# Patient Record
Sex: Female | Born: 1985 | Race: White | Hispanic: No | Smoking: Current every day smoker
Health system: Southern US, Community
[De-identification: ages and names within clinical notes are randomized; demographics above are authoritative.]

## PROBLEM LIST (undated history)

## (undated) HISTORY — PX: TONSILLECTOMY AND ADENOIDECTOMY: SHX28

---

## 2005-07-19 ENCOUNTER — Inpatient Hospital Stay (HOSPITAL_COMMUNITY): Admission: EM | Admit: 2005-07-19 | Discharge: 2005-07-24 | Payer: Self-pay | Admitting: Emergency Medicine

## 2007-06-04 ENCOUNTER — Emergency Department (HOSPITAL_COMMUNITY): Admission: EM | Admit: 2007-06-04 | Discharge: 2007-06-04 | Payer: Self-pay | Admitting: Emergency Medicine

## 2007-09-15 ENCOUNTER — Ambulatory Visit: Payer: Self-pay | Admitting: *Deleted

## 2007-09-15 ENCOUNTER — Other Ambulatory Visit: Payer: Self-pay | Admitting: Emergency Medicine

## 2007-09-15 ENCOUNTER — Inpatient Hospital Stay (HOSPITAL_COMMUNITY): Admission: AD | Admit: 2007-09-15 | Discharge: 2007-09-15 | Payer: Self-pay | Admitting: Obstetrics and Gynecology

## 2007-09-15 ENCOUNTER — Encounter: Payer: Self-pay | Admitting: Obstetrics and Gynecology

## 2007-09-16 ENCOUNTER — Inpatient Hospital Stay (HOSPITAL_COMMUNITY): Admission: AD | Admit: 2007-09-16 | Discharge: 2007-09-16 | Payer: Self-pay | Admitting: Family Medicine

## 2008-07-20 ENCOUNTER — Emergency Department (HOSPITAL_COMMUNITY): Admission: EM | Admit: 2008-07-20 | Discharge: 2008-07-20 | Payer: Self-pay | Admitting: Emergency Medicine

## 2011-05-01 NOTE — Discharge Summary (Signed)
[name], [name]              ACCOUNT NO.:  [id number]   MEDICAL RECORD NO.:  [id number]          PATIENT TYPE:  INP   LOCATION:  9307                          FACILITY:  [facility]   PHYSICIAN:  Tanya S. Shawnie Pons, M.D.   DATE OF BIRTH:  1986/08/15   DATE OF ADMISSION:  09/15/2007  DATE OF DISCHARGE:  09/15/2007                               DISCHARGE SUMMARY   ADMISSION DIAGNOSES:  1. Intrauterine pregnancy at unknown gestational age.  2. Spontaneous onset of labor.  3. No prenatal  care.  4. Spontaneous rupture of membranes.   DISCHARGE DIAGNOSES:  1. Status post spontaneous vaginal delivery postpartum day #0, female      intrauterine fetal demise appearing to be about [redacted] weeks gestation.  2. Elevated blood pressure.  3. Mildly elevated liver function tests.   PROCEDURES:  None.   COMPLICATIONS:  None.   CONSULTATIONS:  None.   LABORATORY VALUES:  [name] had an admission CBC showing white blood  cell count of 17.3, hemoglobin 9.0,  hematocrit 25.8, platelets 315.  HIV screen was nonreactive.  Blood type was O positive. CMP showed  normal electrolytes and kidney function.  AST 63, ALT 35, total protein  5.6, albumin 2.2, calcium 8.3.  LDH 187.  Uric acid 4.5.  RPR  nonreactive.   BRIEF ADMISSION HISTORY:  [name] is a 25 year old, gravida 2, para 1-  1-0-1, at unknown gestational age presenting as a transfer from [facility] in [place], [place], with an intrauterine fetal  demise.  The patient presented to [facility] after spontaneous rupture  of membranes earlier in the morning on September 15, 2007.  The patient  was evaluated there with ultrasound and fetal Doppler showing no fetal  heart sounds.  The patient progressed to completly dilated in transit  and was pushing at time of admission to the MAU.   HOSPITAL COURSE:  The patient was admitted to the MAU for delivery of a  nonviable female intrauterine fetal demise. Spontaneous vaginal delivery  occurred on September 15, 2007, at approximately 7:15 a.m.  The infant  was not moving and did nt spontaneously cry at birth.  Assessment showed  no fetal activity, and the infant was declared deceased after delivery.  The patient was noted to have elevated blood pressure to 160/90 in the  maternity admission unit.  Pregnancy-induced hypertension labs were  drawn showing mild elevation of her AST to 63, but the other labs were  within normal limits.  Her blood pressures were followed and were  150s/70s to 90s.  The patient was evaluated after delivery and found to  have a first-degree laceration that was hemostatic and not repaired.   The patient was doing  well during postpartum day #0, tolerating p.o.,  ambulating with decreasing lochia.  She stated that she did not want to  stay longer and desired discharge.  Due to her elevated blood pressure,  she was counseled to stay but stated she could not stay.  She was  counseled on the risks of possible seizure, continuing liver  dysfunction, bleeding dysfunction, and other  risks associated with  preeclampsia and HELLP syndrome.  The patient agreed to begin taking  hydrochlorothiazide 25 mg and follow up in the maternity admissions unit  tomorrow for blood pressure check.  The patient was to be discharged in  stable condition after funeral arrangements had been made for the  infant.   DISCHARGE STATUS:  Stable.   DISCHARGE MEDICATIONS:  1. Percocet 5/325 one to two tablets p.o. every 6 hours as needed for      pain, dispense #10.  2. Prenatal vitamins 1 p.o. daily.  3. Motrin 600 mg 1 p.o. every 6 hours as needed for pain.  4. Ferrous sulfate 325 mg 1 p.o. b.i.d.  5. Colace 100 mg 1 p.o. b.i.d.  6. Hydrochlorothiazide 25 mg 1 p.o. daily.   DISCHARGE INSTRUCTIONS:  1. Discharge to home.  2. Activity as tolerated.  3. Regular diet.  4. Follow up in the maternity admissions unit on September 16, 2007,      at approximately 9 a.m.  5.  The patient is to follow up for a 6-week postpartum check at the      [facility] Department. The patient is to call and      schedule that appointment.      Karlton Lemon, MD  Electronically Signed     ______________________________  Shelbie Proctor. Shawnie Pons, M.D.    NS/MEDQ  D:  09/15/2007  T:  09/15/2007  Job:  [id number]

## 2011-05-04 NOTE — Discharge Summary (Signed)
NAMEREHMAT, [name]              ACCOUNT NO.:  [id number]   MEDICAL RECORD NO.:  [id number]          PATIENT TYPE:  INP   LOCATION:  A222                          FACILITY:  [facility]   PHYSICIAN:  Calvert Cantor, M.D.     DATE OF BIRTH:  03-13-1986   DATE OF ADMISSION:  07/19/2005  DATE OF DISCHARGE:  LH                                 DISCHARGE SUMMARY   The patient does not have a primary care physician.  She last saw Dr. Gerda Diss  about five years ago.  She states that she wants to return to Dr. Gerda Diss.  However, if he is not taking any new patients, I have assigned her to Dr.  Nobie Putnam according to our unassigned chart.   DISCHARGE DIAGNOSES:  1.  Drug overdose with Flexeril and possibly other medications.  Her urine      was positive for TCA, opiates, and marijuana on admission.  2.  MRSA bronchitis.  3.  Anemia of iron deficiency.  4.  Ileus while in the ICU, now improved.  5.  Tachycardia, possibly secondary to Flexeril overdose.   DISCHARGE MEDICATIONS:  1.  Bactrim-DS 1 tablet q.12h.  2. Iron sulfate 325 mg b.i.d.   HOSPITAL COURSE:  This is a 25 year old white female who was arguing with  her husband and in the process she took his Flexeril which he was prescribed  for his back.  According to her husband, she took about 40-60 tablets of 10  mg of Flexeril.  The patient states that she only took 10 tablets.  The  patient was admitted with severe agitation.  She was intubated and sedated.  The patient remained intubated overnight.  On the following day, a waking  trial was performed, but she was still extremely agitated and, therefore,  had to be sedated again with Diprivan.  On the following day, the waking  trial was performed, and the patient was calm.  She was extubated, and she  did well status post extubation.  The only problem is that she is still  tachycardic.  She has been monitored for another 24 hours out of the ICU.  She is not having any arrythmias and has no  complaints other than a small  amount of yellowish sputum which she has been coughing up.   The patient has been seen by the ACT team and has an appointment for mental  health on 8/10 with Electa Sniff to follow up for her drug overdose and her  other drug abuse issues.   Blood work on discharge:  WBC count is 9.7, hemoglobin 11.5, hematocrit  33.5, MCV 88.  Platelets 396.  Sodium 136, potassium 3.7, chloride 105,  bicarbonate 22, glucose 83.  BUN 3, creatinine 0.5.  Calcium 8.9,  triglycerides 117, free T4 1.02, TSH 2.015, iron level was low at 53.  Iron  binding was 277.  Percent saturation was also low at 19.  Ferritin was 58.  Urine hCG was negative.  Acetaminophen and salicylate levels were less than  10 and less than 4 respectively.  Urine drug screen was positive for  opiates, cannabis, and TCA.  Alcohol level was less than 5. UA was normal.   PHYSICAL EXAMINATION:  Vitals on discharge:  Temperature is 97.9, pulse is  106, respirations are 18, blood pressure is 124/75, pulse ox has been 97% on  room air.   RADIOLOGY:  Chest x-ray on admission showed clear lungs, normal heart.  An x-ray of the abdomen done two days after admission showed a bowel gas  pattern consistent with ileus.    A repeat chest x-ray done on 8/7 to rule out pneumonia was also clear.   FOLLOW UP:  1.  The patient is to have a followup sputum culture in about two weeks'      time.  2. She is to have a followup CBC in about three months to check      her hemoglobin.  3. She is to follow up with mental health for drug      abuse issues.  4. She is to follow up with either Dr. Gerda Diss or Dr.      Nobie Putnam for her anemia and improvement in her tachycardia.      Calvert Cantor, M.D.  Electronically Signed     SR/MEDQ  D:  07/24/2005  T:  07/24/2005  Job:  [id number]   cc:   Lorin Picket A. Gerda Diss, MD  [address]  [place]  [place] [address]  Fax: [phone]   Patrica Duel, M.D.  [address], [address]  [place]  [place] [address]  Fax: [phone]

## 2011-05-04 NOTE — H&P (Signed)
[name], [name]              ACCOUNT NO.:  [id number]   MEDICAL RECORD NO.:  [id number]          PATIENT TYPE:  INP   LOCATION:  IC09                          FACILITY:  [facility]   PHYSICIAN:  [name], M.D.DATE OF BIRTH:  [birth year]   DATE OF ADMISSION:  07/19/2005  DATE OF DISCHARGE:  LH                                HISTORY & PHYSICAL   ADMISSION DIAGNOSES:  1.  Drug overdose with Flexeril.  2.  Acute confusional state with severe agitation.  3.  Possibly polysubstance abuse involved, not fully identified.   CHIEF COMPLAINT:  Drug overdose.   HISTORY OF PRESENT ILLNESS:  Most of the data was obtained from the medical  records and also from the mother of the patient. The patient is extremely  agitated and confused and is unable to give any information. It does appear  that the patient had some misunderstanding with a family member and  subsequently took about 60 mg of Flexeril. The patient has had more than a  five-year history of polysubstance abuse including use of Tylox and other  narcotic analgesia. She also smokes marijuana. The patient had an argument  with her husband and took her husband's pills he is taking for back pain. It  appears that they are fairly sure that all she took was Flexeril.   On arrival in the emergency room, she was extremely agitated and tried to  get out of bed. When I saw her in the intensive care unit, she was very  confused, agitated, and hyperadrenergic. Subsequently, despite giving her  Ativan and Haldol, the patient remained extremely agitated and we proceeded  to intubate her. Intubation was done by Dr. Rhae Lerner. [place].   REVIEW OF SYSTEMS:  Unobtainable.   PAST MEDICAL HISTORY:  None.   MEDICATIONS:  None.   FAMILY HISTORY:  Positive for hypertension, diabetes, coronary artery  disease, anxiety, and depression.   SOCIAL HISTORY:  The patient is currently separated from her husband. Lives  with her husband's brother.  She has had a history of polysubstance abuse  since she was age 67 and subsequently fell pregnant. She has a 12-year-old  daughter. She smokes marijuana. The family is not sure if she not on cocaine  or uses crack cocaine. They are not aware if she does any intravenous drug  abuse.   PHYSICAL EXAMINATION:  GENERAL:  The patient was very agitated, confused,  and extremely unsettled. The patient did not respond to any soothing  techniques. Her mouth is colored with activating charcoal.  VITAL SIGNS:  Her blood pressure is 150/110, pulse of 150, respiratory rate  33, oxygen saturation 98% on two liters.  HEENT:  Difficult to perform. Pupils were slowly reactive. Oral mucosa was  colored with activated charcoal.  LUNGS:  Clear clinically.  HEART:  S1 and S2 tachycardic. No S3, S4, gallops, or rubs.  ABDOMEN:  Soft, nontender. Bowel sounds positive. No masses palpable.  EXTREMITIES:  No pitting pedal edema.  NEUROLOGICAL:  she was extremely agitated and it was difficult to fully  perform a CNS exam.   LABORATORY DATA:  Urine microscopy was negative. Urinalysis was also  negative. Urine toxicology was positive for opiates and for marijuana. White  cell count was 11,000, hemoglobin 13, hematocrit 39, platelet count 445,000.  There was no significant left shift. Alcohol was undetectable. Acetaminophen  was undetectable and salicylate was undetectable. Sodium was 134, potassium  3.6, chloride of 107,  glucose of 100, BUN 8, creatinine 0.5, calcium of 9.  An emergency blood gas obtained on her while she was so restless showed a pH  of 7.49, pCO2 of 14, and a bicarbonate of 10.   ASSESSMENT/PLAN:  This is a 25 year old Caucasian female with drug overdose  with Flexeril. The patient is extremely agitated and confused and is of  significant risk of severe injury to herself. I have proceeded to sedate her  and intubate her for now. We will put her on synchronized intermittent  mandatory ventilation  and manage her ventilation based on her blood gas.   We will put her on banana bag for now.   She does have  acidosis likely related to the overdosed medication. I do not  know if the patient may have taken more medications that we are able to find  out from the drug screen.   We will hydrate her as mentioned above and see if her blood gas improves. We  will also manage her acidosis with a ventilator.   Review of serious reactions from Flexeril include the possibility of  arrhythmias, seizures, hepatitis, and psychosis. We will be on the look out  for this. The patient will see the ACT team when medically cleared.   We will keep on Ativan as needed in combination with propofol to keep her  sedated until hopefully her overdose medications wear off.   I have discussed the above-plan in detail with the mother and sister who  brought her into the hospital.       AM/MEDQ  D:  07/19/2005  T:  07/19/2005  Job:  [id number]

## 2011-05-04 NOTE — Procedures (Signed)
[name], [name]              ACCOUNT NO.:  [id number]   MEDICAL RECORD NO.:  [id number]          PATIENT TYPE:  INP   LOCATION:  IC09                          FACILITY:  [facility]   PHYSICIAN:  Edward L. Juanetta Gosling, M.D.DATE OF BIRTH:  October 13, 1986   DATE OF PROCEDURE:  DATE OF DISCHARGE:                                EKG INTERPRETATION   The rhythm is sinus tachycardia with a rate of about 130.  There are small Q  waves inferiorly which can indicate a previous inferior infarction but these  are rather small and may be of no clinical significance.  Clinical  correlation is suggested.  Abnormal electrocardiogram.       ELH/MEDQ  D:  07/22/2005  T:  07/22/2005  Job:  [id number]

## 2011-09-27 LAB — COMPREHENSIVE METABOLIC PANEL
ALT: 35
AST: 63 — ABNORMAL HIGH
CO2: 21
Chloride: 112
Creatinine, Ser: 0.36 — ABNORMAL LOW
GFR calc non Af Amer: >60
Glucose, Bld: 131 — ABNORMAL HIGH
Potassium: 4.4
Total Protein: 5.6 — ABNORMAL LOW

## 2011-09-27 LAB — BASIC METABOLIC PANEL
CO2: 24
Calcium: 8.7
Chloride: 111
GFR calc Af Amer: >60
Glucose, Bld: 126 — ABNORMAL HIGH
Potassium: 3.9

## 2011-09-27 LAB — CBC
MCHC: 33.5
MCV: 83.9
Platelets: 315
RBC: 3.47 — ABNORMAL LOW
RBC: 3.07 — ABNORMAL LOW
RDW: 14.1 — ABNORMAL HIGH
WBC: 17.3 — ABNORMAL HIGH

## 2011-09-27 LAB — DIFFERENTIAL
Basophils Absolute: 0
Eosinophils Absolute: 0
Eosinophils Relative: 0
Lymphocytes Relative: 7 — ABNORMAL LOW
Lymphs Abs: 1.4
Monocytes Absolute: 1.1 — ABNORMAL HIGH
Monocytes Relative: 5
Neutro Abs: 18.2 — ABNORMAL HIGH
Neutrophils Relative %: 88 — ABNORMAL HIGH

## 2011-09-27 LAB — LACTATE DEHYDROGENASE: LDH: 187

## 2011-09-27 LAB — RPR: RPR Ser Ql: NONREACTIVE

## 2011-09-27 LAB — RAPID HIV SCREEN (WH-MAU): Rapid HIV Screen: NONREACTIVE

## 2011-09-27 LAB — URIC ACID: Uric Acid, Serum: 4.5

## 2012-03-06 ENCOUNTER — Encounter (HOSPITAL_COMMUNITY): Payer: Self-pay | Admitting: *Deleted

## 2012-03-06 ENCOUNTER — Emergency Department (HOSPITAL_COMMUNITY)
Admission: EM | Admit: 2012-03-06 | Discharge: 2012-03-06 | Disposition: A | Discharge status: Home / Self Care | Payer: Self-pay | Attending: Emergency Medicine | Admitting: Emergency Medicine

## 2012-03-06 ENCOUNTER — Emergency Department (HOSPITAL_COMMUNITY): Payer: Self-pay

## 2012-03-06 DIAGNOSIS — J40 Bronchitis, not specified as acute or chronic: Secondary | ICD-10-CM | POA: Insufficient documentation

## 2012-03-06 DIAGNOSIS — R0602 Shortness of breath: Secondary | ICD-10-CM | POA: Insufficient documentation

## 2012-03-06 DIAGNOSIS — R05 Cough: Secondary | ICD-10-CM | POA: Insufficient documentation

## 2012-03-06 DIAGNOSIS — R059 Cough, unspecified: Secondary | ICD-10-CM | POA: Insufficient documentation

## 2012-03-06 MED ORDER — IPRATROPIUM BROMIDE 0.02 % IN SOLN
0.5000 mg | Freq: Once | RESPIRATORY_TRACT | Status: AC
  Administered 2012-03-06: 0.5 mg via RESPIRATORY_TRACT
  Filled 2012-03-06: qty 2.5

## 2012-03-06 MED ORDER — SODIUM CHLORIDE 0.9 % IN NEBU
INHALATION_SOLUTION | RESPIRATORY_TRACT | Status: AC
  Administered 2012-03-06: 3 mL
  Filled 2012-03-06: qty 3

## 2012-03-06 MED ORDER — ALBUTEROL SULFATE HFA 108 (90 BASE) MCG/ACT IN AERS
2.0000 | INHALATION_SPRAY | Freq: Once | RESPIRATORY_TRACT | Status: AC
  Administered 2012-03-06: 2 via RESPIRATORY_TRACT
  Filled 2012-03-06: qty 6.7

## 2012-03-06 MED ORDER — ALBUTEROL SULFATE (5 MG/ML) 0.5% IN NEBU
2.5000 mg | INHALATION_SOLUTION | Freq: Once | RESPIRATORY_TRACT | Status: AC
  Administered 2012-03-06: 2.5 mg via RESPIRATORY_TRACT

## 2012-03-06 MED ORDER — ALBUTEROL SULFATE (5 MG/ML) 0.5% IN NEBU
5.0000 mg | INHALATION_SOLUTION | Freq: Once | RESPIRATORY_TRACT | Status: AC
  Administered 2012-03-06: 5 mg via RESPIRATORY_TRACT
  Filled 2012-03-06: qty 1

## 2012-03-06 MED ORDER — PREDNISONE 20 MG PO TABS
60.0000 mg | ORAL_TABLET | Freq: Once | ORAL | Status: AC
  Administered 2012-03-06: 60 mg via ORAL
  Filled 2012-03-06: qty 3

## 2012-03-06 MED ORDER — ALBUTEROL SULFATE HFA 108 (90 BASE) MCG/ACT IN AERS: 1.0000 | INHALATION_SPRAY | Freq: Four times a day (QID) | RESPIRATORY_TRACT | Status: AC | PRN

## 2012-03-06 MED ORDER — PREDNISONE 10 MG PO TABS: 20.0000 mg | ORAL_TABLET | Freq: Every day | ORAL | Status: DC

## 2012-03-06 MED ORDER — ALBUTEROL SULFATE (5 MG/ML) 0.5% IN NEBU
INHALATION_SOLUTION | RESPIRATORY_TRACT | Status: AC
  Filled 2012-03-06: qty 0.5

## 2012-03-06 MED ORDER — AZITHROMYCIN 250 MG PO TABS: ORAL_TABLET | ORAL | Status: AC

## 2012-03-06 NOTE — ED Provider Notes (Signed)
History   This chart was scribed for Toy Baker, MD scribed by Magnus Sinning. The patient was seen in room APA06/APA06 seen at 15:18   CSN: [id number]  Arrival date & time 03/06/12  1418   First MD Initiated Contact with Patient 03/06/12 1512      Chief Complaint  Patient presents with  . Cough    (Consider location/radiation/quality/duration/timing/severity/associated sxs/prior treatment) HPI [name] is a 26 y.o. female who presents to the Emergency Department complaining of constant moderate SOB with associated productive cough  and ST onset yesterday. She adds that she has experienced pain in "sides and lungs" due to associated non-productive cough. Reports using albuterol inhaler at home with no relief. Denies fever, or hx of asthma. Nothing makes sx better or worse Pt is a current smoker. Denies vomiting or diarrhea History reviewed. No pertinent past medical history.  History reviewed. No pertinent past surgical history.  History reviewed. No pertinent family history.  History  Substance Use Topics  . Smoking status: Current Everyday Smoker -- 1.0 packs/day  . Smokeless tobacco: Not on file  . Alcohol Use: Yes   Review of Systems  HENT: Positive for sore throat.   Respiratory: Positive for cough and shortness of breath.   All other systems reviewed and are negative.   10 Systems reviewed and are negative for acute change except as noted in the HPI. Allergies  Review of patient's allergies indicates no known allergies.  Home Medications   Current Outpatient Rx  Name Route Sig Dispense Refill  . PSEUDOEPH-DOXYLAMINE-DM-APAP 60-12.05-15-999 MG/30ML PO LIQD Oral Take 15 mLs by mouth at bedtime as needed. For cold and flu symptoms      BP 148/107  Pulse 95  Temp(Src) 97.8 F (36.6 C) (Oral)  Resp 20  Ht 5' (1.524 m)  Wt 185 lb 4 oz (84.029 kg)  BMI 36.18 kg/m2  SpO2 98%  LMP 02/29/2012  Physical Exam  Nursing note and vitals  reviewed. Constitutional: She is oriented to person, place, and time. She appears well-developed and well-nourished. No distress.  HENT:  Head: Normocephalic and atraumatic.  Eyes: EOM are normal. Pupils are equal, round, and reactive to light.  Neck: Normal range of motion. Neck supple. No tracheal deviation present.  Cardiovascular: Normal rate.   Pulmonary/Chest: Effort normal. No respiratory distress. She has wheezes (Expiratory bilaterally).  Musculoskeletal: Normal range of motion. She exhibits no edema.  Neurological: She is alert and oriented to person, place, and time. No sensory deficit.  Skin: Skin is warm and dry.  Psychiatric: She has a normal mood and affect. Her behavior is normal.    ED Course  Procedures (including critical care time) DIAGNOSTIC STUDIES: Oxygen Saturation is 98% on room air, normal by my interpretation.    COORDINATION OF CARE: 16:28: Physician notifies patient of laboratory results and informs her that she has bronchitis. Physician notes patient breathing improved with treatment.  Medication Orders  15:00:  Ipratropium (ATROVENT) nebulizer solution 0.5 mg Once  Albuterol (PROVENTIL) (5 MG/ML) 0.5% nebulizer solution 5 mg Once   15:30: PredniSONE (DELTASONE) tablet 60 mg Once   16:00: Albuterol (PROVENTIL) (5 MG/ML) 0.5% nebulizer solution 5 mg Once   Dg Chest 2 View  03/06/2012  *RADIOLOGY REPORT*  Clinical Data: Cough and wheezing for 4 days  CHEST - 2 VIEW  Comparison: 06/04/2007  Findings: Normal heart size, mediastinal contours, and pulmonary vascularity. Bronchitic changes without infiltrate or effusion. No pneumothorax. Bones unremarkable.  IMPRESSION: Chronic bronchitic changes.  Original Report Authenticated By: Lollie Marrow, M.D.    No diagnosis found.    MDM  Pt given prednisone and albuetrol tx, pt rececked multiple times and lungs improved, pt stable for d/c, no hypoxia I personally performed the services described in this  documentation, which was scribed in my presence. The recorded information has been reviewed and considered.          Toy Baker, MD 03/06/12 [id number]

## 2012-03-06 NOTE — ED Notes (Addendum)
Pt c/o cough, congestion, wheezing, and laryngitis since Monday. States that it is getting worse. Also c/o chest pain that is worse with coughing. Coughing up white phlegm.

## 2012-03-06 NOTE — ED Notes (Signed)
Patient given inhaler with spacer. Patient given teaching on inhaler with spacer, verbalized understanding and demonstrated proper use.

## 2012-03-06 NOTE — Discharge Instructions (Signed)

## 2012-03-06 NOTE — ED Notes (Signed)
Patient requesting inhaler here due to copay. EDP made aware-verbal order given.

## 2016-07-12 ENCOUNTER — Encounter: Payer: Self-pay | Admitting: Adult Health

## 2016-08-13 ENCOUNTER — Ambulatory Visit (INDEPENDENT_AMBULATORY_CARE_PROVIDER_SITE_OTHER): Payer: Medicaid Other | Admitting: Adult Health

## 2016-08-13 ENCOUNTER — Encounter: Payer: Self-pay | Admitting: Adult Health

## 2016-08-13 VITALS — BP 120/70 | HR 102 | Ht 60.0 in | Wt 184.0 lb

## 2016-08-13 DIAGNOSIS — Z3490 Encounter for supervision of normal pregnancy, unspecified, unspecified trimester: Secondary | ICD-10-CM

## 2016-08-13 DIAGNOSIS — Z363 Encounter for antenatal screening for malformations: Secondary | ICD-10-CM

## 2016-08-13 DIAGNOSIS — Z3201 Encounter for pregnancy test, result positive: Secondary | ICD-10-CM

## 2016-08-13 LAB — POCT URINE PREGNANCY: PREG TEST UR: POSITIVE — AB

## 2016-08-13 MED ORDER — PRENATAL PLUS 27-1 MG PO TABS
1.0000 | ORAL_TABLET | Freq: Every day | ORAL | 12 refills | Status: DC
Start: 2016-08-13 — End: 2017-01-11

## 2016-08-13 NOTE — Progress Notes (Signed)
Subjective:     Patient ID: [name], female   DOB: [birth year], 30 y.o.   MRN: [id number]  HPI [name] is a 30 year old white female in for UPT, she has had 5+HPT, since taking first one last month.She has felt flutters.She has 32 yo and 32 yo at home.  Review of Systems Patient denies any headaches, hearing loss, fatigue, blurred vision, shortness of breath, chest pain, abdominal pain, problems with bowel movements, urination, or intercourse. No joint pain or mood swings.   Reviewed past medical,surgical, social and family history. Reviewed medications and allergies.  Objective:   Physical Exam BP 120/70 (BP Location: Left Arm, Patient Position: Sitting, Cuff Size: Normal)   Pulse (!) 102   Ht 5' (1.524 m)   Wt 184 lb (83.5 kg)   LMP 04/30/2016 (Exact Date)   BMI 35.94 kg/m UPT +, about 15 weeks by LMP with EDD 02/04/17 but on US looks to be at least 22-24 weeks +FHM 146 and +FM, will get US tomorrow for dating and anatomy. Skin warm and dry. Neck: mid line trachea, normal thyroid, good ROM, no lymphadenopathy noted. Lungs: clear to ausculation bilaterally. Cardiovascular: regular rate and rhythm.Abdomen is soft and non tender fundus above umbilicus.    Assessment:     Pregnancy examination or test, positive result - Plan: POCT urine pregnancy  Pregnant  Antenatal screening for malformation using ultrasonics - Plan: US OB Comp + 14 Wk     Plan:     Rx prenatal plus #30 take 1 daily with 11 refills Return in 1 day for dating and anatomy US Try to decrease smoking    Review handout on second trimester

## 2016-08-13 NOTE — Patient Instructions (Signed)

## 2016-08-14 ENCOUNTER — Other Ambulatory Visit: Payer: Medicaid Other

## 2016-08-15 ENCOUNTER — Ambulatory Visit (INDEPENDENT_AMBULATORY_CARE_PROVIDER_SITE_OTHER): Payer: Medicaid Other

## 2016-08-15 DIAGNOSIS — Z363 Encounter for antenatal screening for malformations: Secondary | ICD-10-CM

## 2016-08-15 DIAGNOSIS — Z3A25 25 weeks gestation of pregnancy: Secondary | ICD-10-CM | POA: Diagnosis not present

## 2016-08-15 DIAGNOSIS — Z36 Encounter for antenatal screening of mother: Secondary | ICD-10-CM

## 2016-08-15 NOTE — Progress Notes (Signed)
US 25 wks,cephalic,normal ov's bilat,svp of fluid 5.6 cm,EFW 865 g,FHR 157 bpm,ant pl gr 0,cx 3.8 cm,limited view of spine because of fetal pos,please have pt come back for additional images,no obvious abnormalities seen

## 2016-08-27 ENCOUNTER — Encounter: Payer: Self-pay | Admitting: Women's Health

## 2016-08-27 ENCOUNTER — Encounter: Payer: Medicaid Other | Admitting: Women's Health

## 2016-09-03 ENCOUNTER — Encounter: Payer: Medicaid Other | Admitting: Women's Health

## 2016-09-13 ENCOUNTER — Encounter: Payer: Self-pay | Admitting: Women's Health

## 2016-09-13 ENCOUNTER — Encounter: Payer: Medicaid Other | Admitting: Women's Health

## 2017-01-09 ENCOUNTER — Emergency Department (HOSPITAL_COMMUNITY)
Admission: EM | Admit: 2017-01-09 | Discharge: 2017-01-09 | Disposition: A | Discharge status: Home / Self Care | Payer: Medicaid Other | Attending: Emergency Medicine | Admitting: Emergency Medicine

## 2017-01-09 ENCOUNTER — Encounter (HOSPITAL_COMMUNITY): Payer: Self-pay | Admitting: Emergency Medicine

## 2017-01-09 DIAGNOSIS — F17210 Nicotine dependence, cigarettes, uncomplicated: Secondary | ICD-10-CM | POA: Diagnosis not present

## 2017-01-09 DIAGNOSIS — L03113 Cellulitis of right upper limb: Secondary | ICD-10-CM

## 2017-01-09 DIAGNOSIS — M7989 Other specified soft tissue disorders: Secondary | ICD-10-CM | POA: Diagnosis present

## 2017-01-09 LAB — CBC WITH DIFFERENTIAL/PLATELET
Basophils Absolute: 0 10*3/uL (ref 0.0–0.1)
Basophils Relative: 0 %
EOS PCT: 0 %
Eosinophils Absolute: 0 10*3/uL (ref 0.0–0.7)
HEMATOCRIT: 28.6 % — AB (ref 36.0–46.0)
HEMOGLOBIN: 9.6 g/dL — AB (ref 12.0–15.0)
LYMPHS ABS: 0.7 10*3/uL (ref 0.7–4.0)
LYMPHS PCT: 13 %
MCH: 26.2 pg (ref 26.0–34.0)
MCHC: 33.6 g/dL (ref 30.0–36.0)
MCV: 78.1 fL (ref 78.0–100.0)
MONO ABS: 0.1 10*3/uL (ref 0.1–1.0)
Monocytes Relative: 2 %
NEUTROS ABS: 4.6 10*3/uL (ref 1.7–7.7)
Neutrophils Relative %: 85 %
Platelets: 201 10*3/uL (ref 150–400)
RBC: 3.66 MIL/uL — ABNORMAL LOW (ref 3.87–5.11)
RDW: 16.4 % — ABNORMAL HIGH (ref 11.5–15.5)
WBC: 5.5 10*3/uL (ref 4.0–10.5)

## 2017-01-09 MED ORDER — IBUPROFEN 600 MG PO TABS: 600.0000 mg | ORAL_TABLET | Freq: Four times a day (QID) | ORAL | 0 refills | Status: DC | PRN

## 2017-01-09 MED ORDER — CLINDAMYCIN HCL 300 MG PO CAPS: 300.0000 mg | ORAL_CAPSULE | Freq: Three times a day (TID) | ORAL | 0 refills | Status: DC

## 2017-01-09 MED ORDER — HYDROCODONE-ACETAMINOPHEN 5-325 MG PO TABS: ORAL_TABLET | ORAL | 0 refills | Status: DC

## 2017-01-09 MED ORDER — CLINDAMYCIN PHOSPHATE 600 MG/50ML IV SOLN
600.0000 mg | Freq: Once | INTRAVENOUS | Status: AC
  Administered 2017-01-09: 600 mg via INTRAVENOUS
  Filled 2017-01-09: qty 50

## 2017-01-09 MED ORDER — OXYCODONE-ACETAMINOPHEN 5-325 MG PO TABS
1.0000 | ORAL_TABLET | Freq: Once | ORAL | Status: AC
Start: 2017-01-09 — End: 2017-01-09
  Administered 2017-01-09: 1 via ORAL
  Filled 2017-01-09: qty 1

## 2017-01-09 NOTE — ED Notes (Signed)
Pt's IV D/C

## 2017-01-09 NOTE — Discharge Instructions (Signed)
Elevate your arm when possible.  Return here in 2 days for recheck.  Return sooner if symptoms worsen

## 2017-01-09 NOTE — ED Triage Notes (Signed)
Notice last night that she was not able to close hand.  Denies any injury to arm.  Rates pain 9/10.   Has swelling to right and elbow.

## 2017-01-11 ENCOUNTER — Encounter (HOSPITAL_COMMUNITY): Payer: Self-pay | Admitting: Emergency Medicine

## 2017-01-11 ENCOUNTER — Inpatient Hospital Stay (HOSPITAL_COMMUNITY)
Admission: EM | Admit: 2017-01-11 | Discharge: 2017-01-18 | DRG: 776 | Disposition: A | Discharge status: Home / Self Care | Payer: Medicaid Other | Attending: Family Medicine | Admitting: Family Medicine

## 2017-01-11 DIAGNOSIS — O9089 Other complications of the puerperium, not elsewhere classified: Principal | ICD-10-CM | POA: Diagnosis present

## 2017-01-11 DIAGNOSIS — O9081 Anemia of the puerperium: Secondary | ICD-10-CM | POA: Diagnosis present

## 2017-01-11 DIAGNOSIS — R609 Edema, unspecified: Secondary | ICD-10-CM

## 2017-01-11 DIAGNOSIS — D649 Anemia, unspecified: Secondary | ICD-10-CM | POA: Diagnosis present

## 2017-01-11 DIAGNOSIS — E878 Other disorders of electrolyte and fluid balance, not elsewhere classified: Secondary | ICD-10-CM | POA: Diagnosis present

## 2017-01-11 DIAGNOSIS — E872 Acidosis, unspecified: Secondary | ICD-10-CM | POA: Diagnosis present

## 2017-01-11 DIAGNOSIS — L0390 Cellulitis, unspecified: Secondary | ICD-10-CM

## 2017-01-11 DIAGNOSIS — O99335 Smoking (tobacco) complicating the puerperium: Secondary | ICD-10-CM | POA: Diagnosis present

## 2017-01-11 DIAGNOSIS — D509 Iron deficiency anemia, unspecified: Secondary | ICD-10-CM | POA: Diagnosis present

## 2017-01-11 DIAGNOSIS — E860 Dehydration: Secondary | ICD-10-CM | POA: Diagnosis present

## 2017-01-11 DIAGNOSIS — F17210 Nicotine dependence, cigarettes, uncomplicated: Secondary | ICD-10-CM | POA: Diagnosis present

## 2017-01-11 DIAGNOSIS — E876 Hypokalemia: Secondary | ICD-10-CM | POA: Diagnosis present

## 2017-01-11 DIAGNOSIS — N179 Acute kidney failure, unspecified: Secondary | ICD-10-CM | POA: Diagnosis present

## 2017-01-11 DIAGNOSIS — L03113 Cellulitis of right upper limb: Secondary | ICD-10-CM

## 2017-01-11 NOTE — ED Provider Notes (Signed)
[facility]Y DEPT Provider Note   CSN: [id number] Arrival date & time: 01/09/17  1742     History   Chief Complaint Chief Complaint  Patient presents with  . Arm Pain    HPI [name] is a 31 y.o. female.  HPI  [name] is a 31 y.o. female who presents to the Emergency Department complaining of pain, redness and swelling of the right forearm and hand. Symptoms began on the night before arrival.  She describes a throbbing pain that is making it difficult to grip objects with her right hand.  She has taken OTC pain medications without relief.  She denies known injury, open wounds, numbness or weakness of the extremity.  She also denies fever, chills, elbow or shoulder pain.  History reviewed. No pertinent past medical history.  There are no active problems to display for this patient.   Past Surgical History:  Procedure Laterality Date  . TONSILLECTOMY AND ADENOIDECTOMY      OB History    Gravida Para Term Preterm AB Living   6 3 2 1 2 3    SAB TAB Ectopic Multiple Live Births   2       2       Home Medications    Prior to Admission medications   Medication Sig Start Date End Date Taking? Authorizing Provider  clindamycin (CLEOCIN) 300 MG capsule Take 1 capsule (300 mg total) by mouth 3 (three) times daily. For 7 days 01/09/17   Pauline Aus, PA-C  HYDROcodone-acetaminophen (NORCO/VICODIN) 5-325 MG tablet Take one tab po q 4-6 hrs prn pain 01/09/17   Freddie Dymek, PA-C  ibuprofen (ADVIL,MOTRIN) 600 MG tablet Take 1 tablet (600 mg total) by mouth every 6 (six) hours as needed. 01/09/17   Billee Balcerzak, PA-C  prenatal vitamin w/FE, FA (PRENATAL 1 + 1) 27-1 MG TABS tablet Take 1 tablet by mouth daily at 12 noon. 08/13/16   Adline Potter, NP    Family History Family History  Problem Relation Age of Onset  . Cancer Maternal Grandmother   . Diabetes Mother     Social History Social History  Substance Use Topics  . Smoking status: Current  Every Day Smoker    Packs/day: 0.50    Years: 21.00    Types: Cigarettes  . Smokeless tobacco: Never Used  . Alcohol use No     Comment: not now     Allergies   Patient has no known allergies.   Review of Systems Review of Systems  Constitutional: Negative for appetite change, chills and fever.  Gastrointestinal: Negative for nausea and vomiting.  Musculoskeletal: Positive for arthralgias and myalgias (right forearm redness and pain). Negative for joint swelling.  Skin: Negative for rash and wound.          Neurological: Negative for weakness and numbness.  Hematological: Negative for adenopathy.     Physical Exam Updated Vital Signs BP 104/64 (BP Location: Right Arm)   Pulse 64   Temp 97.3 F (36.3 C) (Tympanic)   Resp 18   Ht 5' (1.524 m)   Wt 72.6 kg   LMP 11/26/2016   SpO2 100%   Breastfeeding? No   BMI 31.25 kg/m   Physical Exam  Constitutional: She is oriented to person, place, and time. She appears well-developed and well-nourished. No distress.  Neck: Normal range of motion. Neck supple.  Cardiovascular: Intact distal pulses.   Pulmonary/Chest: Effort normal. No respiratory distress.  Musculoskeletal: She exhibits edema and tenderness.  Mild edema, erythema of the volar right forearm.  Edema extends to dorsal right hand. Pt able to partially flex and extend the fingers of the right hand. Radial pulse brisk, distal sensation intact.    Neurological: She is alert and oriented to person, place, and time.  Skin: Skin is warm. Capillary refill takes less than 2 seconds.  Nursing note and vitals reviewed.    ED Treatments / Results  Labs (all labs ordered are listed, but only abnormal results are displayed) Labs Reviewed  CBC WITH DIFFERENTIAL/PLATELET - Abnormal; Notable for the following:       Result Value   RBC 3.66 (*)    Hemoglobin 9.6 (*)    HCT 28.6 (*)    RDW 16.4 (*)    All other components within normal limits    EKG  EKG  Interpretation None         Pt verbalized consent to photographs.  No images saved on any mobile devices.    Radiology No results found.  Procedures Procedures (including critical care time)  Medications Ordered in ED Medications  clindamycin (CLEOCIN) IVPB 600 mg (0 mg Intravenous Stopped 01/09/17 2000)  oxyCODONE-acetaminophen (PERCOCET/ROXICET) 5-325 MG per tablet 1 tablet (1 tablet Oral Given 01/09/17 2030)     Initial Impression / Assessment and Plan / ED Course  I have reviewed the triage vital signs and the nursing notes.  Pertinent labs & imaging results that were available during my care of the patient were reviewed by me and considered in my medical decision making (see chart for details).     Cellulitis of the right forearm. NV intact.  No stated injury, compartments soft.  No obvious abscess.  IV clinda here.  rx written for same.  Pt advised to minimal use, elevate and ER return in 2 days for recheck or sooner if worsening.  Pt agrees to plan.  Final Clinical Impressions(s) / ED Diagnoses   Final diagnoses:  Cellulitis of right forearm    New Prescriptions Discharge Medication List as of 01/09/2017  8:44 PM    START taking these medications   Details  clindamycin (CLEOCIN) 300 MG capsule Take 1 capsule (300 mg total) by mouth 3 (three) times daily. For 7 days, Starting Wed 01/09/2017, Print    HYDROcodone-acetaminophen (NORCO/VICODIN) 5-325 MG tablet Take one tab po q 4-6 hrs prn pain, Print    ibuprofen (ADVIL,MOTRIN) 600 MG tablet Take 1 tablet (600 mg total) by mouth every 6 (six) hours as needed., Starting Wed 01/09/2017, Print         Jarryd Gratz [place], PA-C 01/11/17 2145    Marily MemosJason Mesner, MD 01/12/17 [phone]

## 2017-01-11 NOTE — ED Provider Notes (Signed)
[facility] Provider Note   CSN: [id number] Arrival date & time: 01/11/17  2102 By signing my name below, I, Levon Hedger, attest that this documentation has been prepared under the direction and in the presence of Gilda Crease, MD . Electronically Signed: Levon Hedger, Scribe. 01/11/2017. 11:48 PM.   History   Chief Complaint Chief Complaint  Patient presents with  . Arm Swelling   HPI [name] is a 31 y.o. female who presents to the Emergency Department complaining of persistent, gradaully worsening right forearm swelling which began four days ago. She notes associated severe pain, chills, diaphoresis, and erythema. Pt was seen here for the same two days later and was given IV clindamycin and was written a prescription of oral clindamycin. She has taken her abx as well as Motrin and ibuprofen for her pain with no relief. She also reports pain and swelling to her right knee.  Pt denies any nausea, vomiting or any other associated symptoms.   The history is provided by the patient. No language interpreter was used.   History reviewed. No pertinent past medical history.  Patient Active Problem List   Diagnosis Date Noted  . Cellulitis 01/12/2017    Past Surgical History:  Procedure Laterality Date  . TONSILLECTOMY AND ADENOIDECTOMY      OB History    Gravida Para Term Preterm AB Living   6 3 2 1 2 3    SAB TAB Ectopic Multiple Live Births   2       2     Home Medications    Prior to Admission medications   Medication Sig Start Date End Date Taking? Authorizing Provider  clindamycin (CLEOCIN) 300 MG capsule Take 1 capsule (300 mg total) by mouth 3 (three) times daily. For 7 days 01/09/17  Yes Tammy Triplett, PA-C  HYDROcodone-acetaminophen (NORCO/VICODIN) 5-325 MG tablet Take one tab po q 4-6 hrs prn pain Patient taking differently: Take 1 tablet by mouth every 4 (four) hours as needed for moderate pain or severe pain. Take one tab po q 4-6 hrs prn  pain 01/09/17  Yes Tammy Triplett, PA-C  naproxen sodium (ALEVE) 220 MG tablet Take 440 mg by mouth daily as needed (for swelling).   Yes Historical Provider, MD  ibuprofen (ADVIL,MOTRIN) 600 MG tablet Take 1 tablet (600 mg total) by mouth every 6 (six) hours as needed. Patient not taking: Reported on 01/11/2017 01/09/17   Pauline Aus, PA-C    Family History Family History  Problem Relation Age of Onset  . Cancer Maternal Grandmother   . Diabetes Mother     Social History Social History  Substance Use Topics  . Smoking status: Current Every Day Smoker    Packs/day: 0.50    Years: 21.00    Types: Cigarettes  . Smokeless tobacco: Never Used  . Alcohol use No     Comment: not now     Allergies   Patient has no known allergies.  Review of Systems Review of Systems 10 systems reviewed and all are negative for acute change except as noted in the HPI.  Physical Exam Updated Vital Signs BP 126/78 (BP Location: Left Arm)   Pulse 97   Temp 97.8 F (36.6 C) (Oral)   Resp 18   Ht 5' (1.524 m)   Wt 160 lb (72.6 kg)   LMP 04/30/2016 (Exact Date)   SpO2 100%   BMI 31.25 kg/m   Physical Exam  Constitutional: She is oriented to person, place, and time.  She appears well-developed and well-nourished. No distress.  HENT:  Head: Normocephalic and atraumatic.  Right Ear: Hearing normal.  Left Ear: Hearing normal.  Nose: Nose normal.  Mouth/Throat: Oropharynx is clear and moist and mucous membranes are normal.  Eyes: Conjunctivae and EOM are normal. Pupils are equal, round, and reactive to light.  Neck: Normal range of motion. Neck supple.  Cardiovascular: Regular rhythm, S1 normal and S2 normal.  Exam reveals no gallop and no friction rub.   No murmur heard. Pulmonary/Chest: Effort normal and breath sounds normal. No respiratory distress. She exhibits no tenderness.  Abdominal: Soft. Normal appearance and bowel sounds are normal. There is no hepatosplenomegaly. There is no  tenderness. There is no rebound, no guarding, no tenderness at McBurney's point and negative Murphy's sign. No hernia.  Musculoskeletal: Normal range of motion.  Swelling and erythema to right hand, wrist, forearm. Diffuse tenderness. Erythema proximal to the posterior aspect of the elbow.   Neurological: She is alert and oriented to person, place, and time. She has normal strength. No cranial nerve deficit or sensory deficit. Coordination normal. GCS eye subscore is 4. GCS verbal subscore is 5. GCS motor subscore is 6.  Skin: Skin is warm, dry and intact. No rash noted. No cyanosis.  Psychiatric: She has a normal mood and affect. Her speech is normal and behavior is normal. Thought content normal.  Nursing note and vitals reviewed.  ED Treatments / Results  DIAGNOSTIC STUDIES:  Oxygen Saturation is 100% on RA, normal by my interpretation.    COORDINATION OF CARE:  11:19 PM Discussed treatment plan with pt at bedside and pt agreed to plan.  Labs (all labs ordered are listed, but only abnormal results are displayed) Labs Reviewed  CBC WITH DIFFERENTIAL/PLATELET - Abnormal; Notable for the following:       Result Value   WBC 11.6 (*)    RBC 3.50 (*)    Hemoglobin 9.2 (*)    HCT 26.7 (*)    MCV 76.3 (*)    RDW 16.9 (*)    All other components within normal limits  BASIC METABOLIC PANEL - Abnormal; Notable for the following:    Potassium 3.3 (*)    Chloride 112 (*)    CO2 17 (*)    Glucose, Bld 106 (*)    BUN 45 (*)    Creatinine, Ser 1.03 (*)    Calcium 8.4 (*)    All other components within normal limits  CULTURE, BLOOD (ROUTINE X 2)  CULTURE, BLOOD (ROUTINE X 2)  I-STAT CG4 LACTIC ACID, ED    EKG  EKG Interpretation None       Radiology No results found.  Procedures Procedures (including critical care time)  Medications Ordered in ED Medications  clindamycin (CLEOCIN) IVPB 600 mg (not administered)     Initial Impression / Assessment and Plan / ED Course    I have reviewed the triage vital signs and the nursing notes.  Pertinent labs & imaging results that were available during my care of the patient were reviewed by me and considered in my medical decision making (see chart for details).    Patient presents with complaints of worsening pain and swelling of her right arm. Patient seen 2 nights ago and diagnosed with cellulitis. She was given IV clindamycin and is on oral clindamycin. Patient reports that the area has diffusely worsened. Examination reveals significant swelling of the right forearm with erythema and warmth consistent with infection. There is no sign of focality  around any of the joints to suggest septic arthritis. Patient has failed outpatient therapy with oral clindamycin, will be admitted to the hospitalist.  Final Clinical Impressions(s) / ED Diagnoses   Final diagnoses:  Cellulitis of right upper extremity    New Prescriptions New Prescriptions   No medications on file  I personally performed the services described in this documentation, which was scribed in my presence. The recorded information has been reviewed and is accurate.    Gilda Crease, MD 01/12/17 0157

## 2017-01-11 NOTE — ED Triage Notes (Signed)
Pt here on wed dx with cellulitis of rt arm.  Told to come back today for recheck.  Pt states her arm is worse.  Arm is swollen red and painful

## 2017-01-12 ENCOUNTER — Observation Stay (HOSPITAL_COMMUNITY): Payer: Medicaid Other

## 2017-01-12 DIAGNOSIS — E860 Dehydration: Secondary | ICD-10-CM | POA: Diagnosis present

## 2017-01-12 DIAGNOSIS — E878 Other disorders of electrolyte and fluid balance, not elsewhere classified: Secondary | ICD-10-CM | POA: Diagnosis present

## 2017-01-12 DIAGNOSIS — E876 Hypokalemia: Secondary | ICD-10-CM | POA: Diagnosis not present

## 2017-01-12 DIAGNOSIS — L03113 Cellulitis of right upper limb: Secondary | ICD-10-CM | POA: Diagnosis not present

## 2017-01-12 DIAGNOSIS — N179 Acute kidney failure, unspecified: Secondary | ICD-10-CM | POA: Diagnosis present

## 2017-01-12 DIAGNOSIS — D649 Anemia, unspecified: Secondary | ICD-10-CM | POA: Diagnosis not present

## 2017-01-12 DIAGNOSIS — D509 Iron deficiency anemia, unspecified: Secondary | ICD-10-CM | POA: Diagnosis present

## 2017-01-12 DIAGNOSIS — M79601 Pain in right arm: Secondary | ICD-10-CM | POA: Diagnosis not present

## 2017-01-12 DIAGNOSIS — O9089 Other complications of the puerperium, not elsewhere classified: Secondary | ICD-10-CM | POA: Diagnosis not present

## 2017-01-12 DIAGNOSIS — O99335 Smoking (tobacco) complicating the puerperium: Secondary | ICD-10-CM | POA: Diagnosis present

## 2017-01-12 DIAGNOSIS — E872 Acidosis, unspecified: Secondary | ICD-10-CM | POA: Diagnosis present

## 2017-01-12 DIAGNOSIS — O9081 Anemia of the puerperium: Secondary | ICD-10-CM | POA: Diagnosis present

## 2017-01-12 DIAGNOSIS — F17210 Nicotine dependence, cigarettes, uncomplicated: Secondary | ICD-10-CM | POA: Diagnosis present

## 2017-01-12 DIAGNOSIS — L0390 Cellulitis, unspecified: Secondary | ICD-10-CM | POA: Diagnosis present

## 2017-01-12 LAB — BASIC METABOLIC PANEL
ANION GAP: 12 (ref 5–15)
BUN: 45 mg/dL — AB (ref 6–20)
CALCIUM: 8.4 mg/dL — AB (ref 8.9–10.3)
CO2: 17 mmol/L — ABNORMAL LOW (ref 22–32)
Chloride: 112 mmol/L — ABNORMAL HIGH (ref 101–111)
Creatinine, Ser: 1.03 mg/dL — ABNORMAL HIGH (ref 0.44–1.00)
GFR calc Af Amer: >60 mL/min (ref >60)
Glucose, Bld: 106 mg/dL — ABNORMAL HIGH (ref 65–99)
Potassium: 3.3 mmol/L — ABNORMAL LOW (ref 3.5–5.1)
SODIUM: 141 mmol/L (ref 135–145)

## 2017-01-12 LAB — CBC WITH DIFFERENTIAL/PLATELET
BASOS ABS: 0 10*3/uL (ref 0.0–0.1)
Basophils Relative: 0 %
EOS ABS: 0.1 10*3/uL (ref 0.0–0.7)
EOS PCT: 0 %
HCT: 26.7 % — ABNORMAL LOW (ref 36.0–46.0)
Hemoglobin: 9.2 g/dL — ABNORMAL LOW (ref 12.0–15.0)
Lymphocytes Relative: 32 %
Lymphs Abs: 3.7 10*3/uL (ref 0.7–4.0)
MCH: 26.3 pg (ref 26.0–34.0)
MCHC: 34.5 g/dL (ref 30.0–36.0)
MCV: 76.3 fL — ABNORMAL LOW (ref 78.0–100.0)
MONO ABS: 0.7 10*3/uL (ref 0.1–1.0)
Monocytes Relative: 6 %
Neutro Abs: 7.2 10*3/uL (ref 1.7–7.7)
Neutrophils Relative %: 62 %
PLATELETS: 253 10*3/uL (ref 150–400)
RBC: 3.5 MIL/uL — AB (ref 3.87–5.11)
RDW: 16.9 % — AB (ref 11.5–15.5)
WBC: 11.6 10*3/uL — AB (ref 4.0–10.5)

## 2017-01-12 LAB — BLOOD GAS, ARTERIAL
Acid-base deficit: 6.5 mmol/L — ABNORMAL HIGH (ref 0.0–2.0)
Bicarbonate: 19.5 mmol/L — ABNORMAL LOW (ref 20.0–28.0)
DRAWN BY: 105551
FIO2: 0.21
O2 SAT: 96.6 %
PO2 ART: 93.4 mmHg (ref 83.0–108.0)
pCO2 arterial: 28.9 mmHg — ABNORMAL LOW (ref 32.0–48.0)
pH, Arterial: 7.398 (ref 7.350–7.450)

## 2017-01-12 LAB — I-STAT CG4 LACTIC ACID, ED: LACTIC ACID, VENOUS: 1.29 mmol/L (ref 0.5–1.9)

## 2017-01-12 LAB — MRSA PCR SCREENING: MRSA BY PCR: NEGATIVE

## 2017-01-12 MED ORDER — HYDROCODONE-ACETAMINOPHEN 5-325 MG PO TABS
1.0000 | ORAL_TABLET | ORAL | Status: DC | PRN
  Administered 2017-01-12 – 2017-01-13 (×5): 1 via ORAL
  Filled 2017-01-12 (×5): qty 1

## 2017-01-12 MED ORDER — POTASSIUM CHLORIDE IN NACL 20-0.9 MEQ/L-% IV SOLN
INTRAVENOUS | Status: DC
  Administered 2017-01-12: 05:00:00 via INTRAVENOUS

## 2017-01-12 MED ORDER — CLINDAMYCIN PHOSPHATE 600 MG/50ML IV SOLN
600.0000 mg | Freq: Four times a day (QID) | INTRAVENOUS | Status: DC
  Administered 2017-01-12 – 2017-01-13 (×6): 600 mg via INTRAVENOUS
  Filled 2017-01-12 (×11): qty 50

## 2017-01-12 MED ORDER — CLINDAMYCIN PHOSPHATE 600 MG/50ML IV SOLN
INTRAVENOUS | Status: AC
  Filled 2017-01-12: qty 50

## 2017-01-12 MED ORDER — KETOROLAC TROMETHAMINE 30 MG/ML IJ SOLN
30.0000 mg | Freq: Four times a day (QID) | INTRAMUSCULAR | Status: AC
  Administered 2017-01-12 – 2017-01-14 (×8): 30 mg via INTRAVENOUS
  Filled 2017-01-12 (×8): qty 1

## 2017-01-12 MED ORDER — POTASSIUM CHLORIDE IN NACL 20-0.45 MEQ/L-% IV SOLN
INTRAVENOUS | Status: DC
  Administered 2017-01-12 – 2017-01-17 (×9): via INTRAVENOUS
  Filled 2017-01-12 (×15): qty 1000

## 2017-01-12 NOTE — H&P (Signed)
History and Physical    [name]:[id number] DOB: [birth year] DOA: 01/11/2017  PCP: [facility]  Patient coming from: home  Chief Complaint:  Right arm swelling  HPI: [name] is a 31 y.o. female with medical history significant of SVD 6 weeks ago to her 3rd child comes in with over 2 days of progressive worsening swelling and redness to her right forearm.  Pt reports she came to the ED 2 days ago, at that time the swelling was more below the elbow and was a little red, she was given iv clindamycin in the ED and sent home on oral clinda 300mg  tid which she has been taking.  However the swelling and redness has worsened and she started to get chills.  During her vaginal delivery, it was a normal delivery and her iv was in her right [facility] area.  She has no h/o skin infections in the past.  There has been no trauma to this arm.  Pt referred for admission for worsening cellulitis to the right arm.   Review of Systems: As per HPI otherwise 10 point review of systems negative.   History reviewed. No pertinent past medical history.   Past Surgical History:  Procedure Laterality Date  . TONSILLECTOMY AND ADENOIDECTOMY       reports that she has been smoking Cigarettes.  She has a 10.50 pack-year smoking history. She has never used smokeless tobacco. She reports that she does not drink alcohol or use drugs.  No Known Allergies  Family History  Problem Relation Age of Onset  . Cancer Maternal Grandmother   . Diabetes Mother     Prior to Admission medications   Medication Sig Start Date End Date Taking? Authorizing Provider  clindamycin (CLEOCIN) 300 MG capsule Take 1 capsule (300 mg total) by mouth 3 (three) times daily. For 7 days 01/09/17  Yes Tammy Triplett, PA-C  HYDROcodone-acetaminophen (NORCO/VICODIN) 5-325 MG tablet Take one tab po q 4-6 hrs prn pain Patient taking differently: Take 1 tablet by mouth every 4 (four) hours as needed for moderate pain or severe  pain. Take one tab po q 4-6 hrs prn pain 01/09/17  Yes Tammy Triplett, PA-C  naproxen sodium (ALEVE) 220 MG tablet Take 440 mg by mouth daily as needed (for swelling).   Yes Historical Provider, MD  ibuprofen (ADVIL,MOTRIN) 600 MG tablet Take 1 tablet (600 mg total) by mouth every 6 (six) hours as needed. Patient not taking: Reported on 01/11/2017 01/09/17   Pauline Aus, PA-C    Physical Exam: Vitals:   01/11/17 2109 01/11/17 2110 01/12/17 0241 01/12/17 0300  BP: 126/78  103/73 120/83  Pulse: 97  91 90  Resp: 18  18   Temp: 97.8 F (36.6 C)  98.7 F (37.1 C) 98.6 F (37 C)  TempSrc: Oral  Oral Oral  SpO2: 100%  100% 100%  Weight:  72.6 kg (160 lb)  73 kg (160 lb 15 oz)  Height:  5' (1.524 m)  5' (1.524 m)    Constitutional: NAD, calm, comfortable Vitals:   01/11/17 2109 01/11/17 2110 01/12/17 0241 01/12/17 0300  BP: 126/78  103/73 120/83  Pulse: 97  91 90  Resp: 18  18   Temp: 97.8 F (36.6 C)  98.7 F (37.1 C) 98.6 F (37 C)  TempSrc: Oral  Oral Oral  SpO2: 100%  100% 100%  Weight:  72.6 kg (160 lb)  73 kg (160 lb 15 oz)  Height:  5' (1.524  m)  5' (1.524 m)   Eyes: PERRL, lids and conjunctivae normal ENMT: Mucous membranes are moist. Posterior pharynx clear of any exudate or lesions.Normal dentition.  Neck: normal, supple, no masses, no thyromegaly Respiratory: clear to auscultation bilaterally, no wheezing, no crackles. Normal respiratory effort. No accessory muscle use.  Cardiovascular: Regular rate and rhythm, no murmurs / rubs / gallops. No extremity edema. 2+ pedal pulses. No carotid bruits.  Abdomen: no tenderness, no masses palpated. No hepatosplenomegaly. Bowel sounds positive.  Musculoskeletal: no clubbing / cyanosis. No joint deformity upper and lower extremities x right swelling to arm moderate with erythema c/w cellulitis, no palpable flunctuance or induration. Good ROM, no contractures. Normal muscle tone.  Skin: no rashes, lesions, ulcers. No  induration Neurologic: CN 2-12 grossly intact. Sensation intact, DTR normal. Strength 5/5 in all 4.  Psychiatric: Normal judgment and insight. Alert and oriented x 3. Normal mood.    Labs on Admission: I have personally reviewed following labs and imaging studies  CBC:  Recent Labs Lab 01/09/17 1915 01/12/17 0031  WBC 5.5 11.6*  NEUTROABS 4.6 7.2  HGB 9.6* 9.2*  HCT 28.6* 26.7*  MCV 78.1 76.3*  PLT 201 253   Basic Metabolic Panel:  Recent Labs Lab 01/12/17 0031  NA 141  K 3.3*  CL 112*  CO2 17*  GLUCOSE 106*  BUN 45*  CREATININE 1.03*  CALCIUM 8.4*   GFR: Estimated Creatinine Clearance: 69.9 mL/min (by C-G formula based on SCr of 1.03 mg/dL (H)).  Recent Results (from the past 240 hour(s))  Culture, blood (Routine X 2) w Reflex to ID Panel     Status: None (Preliminary result)   Collection Time: 01/11/17 12:45 AM  Result Value Ref Range Status   Specimen Description LEFT ANTECUBITAL  Final   Special Requests   Final    BOTTLES DRAWN AEROBIC AND ANAEROBIC 6CC DRAWN BY RN   Culture PENDING  Incomplete   Report Status PENDING  Incomplete  Culture, blood (Routine X 2) w Reflex to ID Panel     Status: None (Preliminary result)   Collection Time: 01/12/17 12:31 AM  Result Value Ref Range Status   Specimen Description WRIST  Final   Special Requests   Final    BOTTLES DRAWN AEROBIC AND ANAEROBIC 6CC DRAWN BY RN   Culture PENDING  Incomplete   Report Status PENDING  Incomplete     Radiological Exams on Admission: No results found.   Assessment/Plan 31 yo female with worsening RUE cellulitis  Principal Problem:   Cellulitis- place on iv clindamycin at higher dose and more frequent.  Elevate arm.  Warm compresses.    Active Problems:   Postpartum state- not breast feeding, will also ultrasound to arm to r/o DVT   Normocytic anemia- noted, outpatient follow up   Metabolic acidosis- check abg.     Hypokalemia- replete, check mag level    DVT prophylaxis:  scds Code Status:  full Family Communication: none  Disposition Plan:  Per day team Consults called:  none Admission status:  observation   Lux Skilton A MD [facility]  If 7PM-7AM, please contact night-coverage [email or web address] Password [email or web address]  01/12/2017, 4:53 AM

## 2017-01-12 NOTE — Progress Notes (Signed)
Patient admitted to the hospital earlier this morning by Dr. Onalee Huaavid.  Patient seen and examined. She continues to have pain in her right arm. Right arm is edematous, warm and erythematous without clear point of fluctuation.  She has been admitted to the hospital with right arm cellulitis after failing outpatient antibiotics. Currently on IV clindamycin. Will continue on IV antibiotics. Venous dopplers have been ordered to rule out DVT. Continue to elevate arm. Continue current treatments.  [name]

## 2017-01-12 NOTE — Progress Notes (Signed)
Patient transferred to 300 via wheelchair report given to nurse.

## 2017-01-13 DIAGNOSIS — N179 Acute kidney failure, unspecified: Secondary | ICD-10-CM | POA: Diagnosis present

## 2017-01-13 LAB — CBC
HCT: 22 % — ABNORMAL LOW (ref 36.0–46.0)
Hemoglobin: 7.4 g/dL — ABNORMAL LOW (ref 12.0–15.0)
MCH: 25.7 pg — AB (ref 26.0–34.0)
MCHC: 33.6 g/dL (ref 30.0–36.0)
MCV: 76.4 fL — ABNORMAL LOW (ref 78.0–100.0)
PLATELETS: 310 10*3/uL (ref 150–400)
RBC: 2.88 MIL/uL — ABNORMAL LOW (ref 3.87–5.11)
RDW: 17 % — AB (ref 11.5–15.5)
WBC: 10.3 10*3/uL (ref 4.0–10.5)

## 2017-01-13 LAB — BASIC METABOLIC PANEL
Anion gap: 8 (ref 5–15)
BUN: 24 mg/dL — ABNORMAL HIGH (ref 6–20)
CALCIUM: 8.1 mg/dL — AB (ref 8.9–10.3)
CO2: 17 mmol/L — AB (ref 22–32)
CREATININE: 0.68 mg/dL (ref 0.44–1.00)
Chloride: 114 mmol/L — ABNORMAL HIGH (ref 101–111)
GFR calc Af Amer: >60 mL/min (ref >60)
GLUCOSE: 91 mg/dL (ref 65–99)
Potassium: 3.8 mmol/L (ref 3.5–5.1)
Sodium: 139 mmol/L (ref 135–145)

## 2017-01-13 MED ORDER — PIPERACILLIN-TAZOBACTAM 3.375 G IVPB
3.3750 g | Freq: Three times a day (TID) | INTRAVENOUS | Status: DC
  Administered 2017-01-13 – 2017-01-17 (×12): 3.375 g via INTRAVENOUS
  Filled 2017-01-13 (×12): qty 50

## 2017-01-13 MED ORDER — OXYCODONE-ACETAMINOPHEN 5-325 MG PO TABS
1.0000 | ORAL_TABLET | ORAL | Status: DC | PRN
  Administered 2017-01-13 – 2017-01-17 (×19): 2 via ORAL
  Administered 2017-01-17: 1 via ORAL
  Administered 2017-01-17 – 2017-01-18 (×3): 2 via ORAL
  Filled 2017-01-13 (×23): qty 2

## 2017-01-13 MED ORDER — VANCOMYCIN HCL IN DEXTROSE 1-5 GM/200ML-% IV SOLN
1000.0000 mg | Freq: Two times a day (BID) | INTRAVENOUS | Status: DC
  Administered 2017-01-13 – 2017-01-16 (×7): 1000 mg via INTRAVENOUS
  Filled 2017-01-13 (×7): qty 200

## 2017-01-13 MED ORDER — VANCOMYCIN HCL IN DEXTROSE 1-5 GM/200ML-% IV SOLN
1000.0000 mg | Freq: Once | INTRAVENOUS | Status: AC
  Administered 2017-01-13: 1000 mg via INTRAVENOUS
  Filled 2017-01-13: qty 200

## 2017-01-13 MED ORDER — PIPERACILLIN-TAZOBACTAM 3.375 G IVPB 30 MIN: 3.3750 g | Freq: Once | INTRAVENOUS | Status: DC

## 2017-01-13 NOTE — Progress Notes (Signed)
[name]  WJX:[id number] DOB: 1986/01/24 DOA: 01/11/2017 PCP: [facility]    Brief Narrative:  31 y/o female who recently delivered her 3rd child 6 weeks ago, presented with cellulitis of right arm. She was treated in ED and sent home with clindamycin, but failed treatment and required admission. She is currently on IV antibiotics.   Assessment & Plan:   Principal Problem:   Cellulitis Active Problems:   Postpartum state   Normocytic anemia   Metabolic acidosis   Hypokalemia   Cellulitis of right upper extremity   1. Cellulitis of right upper extremity. Felt to be related to recent IV in that arm. No evidence of DVT on imaging. Initially started on clindamycin without improvement. Will change to vancomycin and zosyn. Continue to follow  2. Normocytic anemia. Check anemia panel. No evidence of ongoing bleeding. Unclear where her baseline hemoglobin runs. Will transfuse for hemoglobin <7.   3. Hypokalemia. improved with replacement  4. Non gap Metabolic acidosis. Likely related to hyperchloremia. Continue hypotonic fluids.  5. AKI. Related to dehydration. Improved with fluid hydration. Creatinine was 1.03 on admission and down to 0.6 with hydration   DVT prophylaxis: scd Code Status: full Family Communication: discussed with patient Disposition Plan: discharge home once improved   Consultants:     Procedures:     Antimicrobials:   Clindamycin 1/27>>1/28  Vancomycin 1/28>>  Zosyn 1/28>>   Subjective: Continued pain in forearm and hand without improvement  Objective: Vitals:   01/12/17 0800 01/12/17 1538 01/12/17 2110 01/13/17 0520  BP: 108/62 124/77 121/74 124/73  Pulse: 79 89 86 84  Resp: 14 16 18 18   Temp:  98.5 F (36.9 C) 98.4 F (36.9 C) 98.6 F (37 C)  TempSrc:   Oral Oral  SpO2: 98% 100% 100% 100%  Weight:      Height:        Intake/Output Summary (Last 24 hours) at 01/13/17 1204 Last data filed at  01/12/17 1652  Gross per 24 hour  Intake           976.67 ml  Output                0 ml  Net           976.67 ml   Filed Weights   01/11/17 2110 01/12/17 0300  Weight: 72.6 kg (160 lb) 73 kg (160 lb 15 oz)    Examination:  General exam: Appears calm and comfortable  Respiratory system: Clear to auscultation. Respiratory effort normal. Cardiovascular system: S1 & S2 heard, RRR. No JVD, murmurs, rubs, gallops or clicks. No pedal edema. Gastrointestinal system: Abdomen is nondistended, soft and nontender. No organomegaly or masses felt. Normal bowel sounds heard. Central nervous system: Alert and oriented. No focal neurological deficits. Extremities: Symmetric 5 x 5 power. Skin: right hand/forearm still erythematous, edematous and tender to touch Psychiatry: Judgement and insight appear normal. Mood & affect appropriate.     Data Reviewed: I have personally reviewed following labs and imaging studies  CBC:  Recent Labs Lab 01/09/17 1915 01/12/17 0031 01/13/17 0701  WBC 5.5 11.6* 10.3  NEUTROABS 4.6 7.2  --   HGB 9.6* 9.2* 7.4*  HCT 28.6* 26.7* 22.0*  MCV 78.1 76.3* 76.4*  PLT 201 253 310   Basic Metabolic Panel:  Recent Labs Lab 01/12/17 0031 01/13/17 0701  NA 141 139  K 3.3* 3.8  CL 112* 114*  CO2 17* 17*  GLUCOSE 106*  91  BUN 45* 24*  CREATININE 1.03* 0.68  CALCIUM 8.4* 8.1*   GFR: Estimated Creatinine Clearance: 90 mL/min (by C-G formula based on SCr of 0.68 mg/dL). Liver Function Tests: No results for input(s): AST, ALT, ALKPHOS, BILITOT, PROT, ALBUMIN in the last 168 hours. No results for input(s): LIPASE, AMYLASE in the last 168 hours. No results for input(s): AMMONIA in the last 168 hours. Coagulation Profile: No results for input(s): INR, PROTIME in the last 168 hours. Cardiac Enzymes: No results for input(s): CKTOTAL, CKMB, CKMBINDEX, TROPONINI in the last 168 hours. BNP (last 3 results) No results for input(s): PROBNP in the last 8760  hours. HbA1C: No results for input(s): HGBA1C in the last 72 hours. CBG: No results for input(s): GLUCAP in the last 168 hours. Lipid Profile: No results for input(s): CHOL, HDL, LDLCALC, TRIG, CHOLHDL, LDLDIRECT in the last 72 hours. Thyroid Function Tests: No results for input(s): TSH, T4TOTAL, FREET4, T3FREE, THYROIDAB in the last 72 hours. Anemia Panel: No results for input(s): VITAMINB12, FOLATE, FERRITIN, TIBC, IRON, RETICCTPCT in the last 72 hours. Sepsis Labs:  Recent Labs Lab 01/12/17 0040  LATICACIDVEN 1.29    Recent Results (from the past 240 hour(s))  Culture, blood (Routine X 2) w Reflex to ID Panel     Status: None (Preliminary result)   Collection Time: 01/11/17 12:45 AM  Result Value Ref Range Status   Specimen Description LEFT ANTECUBITAL  Final   Special Requests   Final    BOTTLES DRAWN AEROBIC AND ANAEROBIC 6CC DRAWN BY RN   Culture NO GROWTH 1 DAY  Final   Report Status PENDING  Incomplete  Culture, blood (Routine X 2) w Reflex to ID Panel     Status: None (Preliminary result)   Collection Time: 01/12/17 12:31 AM  Result Value Ref Range Status   Specimen Description WRIST  Final   Special Requests   Final    BOTTLES DRAWN AEROBIC AND ANAEROBIC 6CC DRAWN BY RN   Culture NO GROWTH 1 DAY  Final   Report Status PENDING  Incomplete  MRSA PCR Screening     Status: None   Collection Time: 01/12/17  3:05 AM  Result Value Ref Range Status   MRSA by PCR NEGATIVE NEGATIVE Final    Comment:        The GeneXpert MRSA Assay (FDA approved for NASAL specimens only), is one component of a comprehensive MRSA colonization surveillance program. It is not intended to diagnose MRSA infection nor to guide or monitor treatment for MRSA infections.          Radiology Studies: [place] Venous Img Upper Uni Right  Result Date: 01/12/2017 CLINICAL DATA:  Right upper extremity edema, redness, discomfort 2 days EXAM: RIGHT UPPER EXTREMITY VENOUS DOPPLER ULTRASOUND  TECHNIQUE: Gray-scale sonography with graded compression, as well as color Doppler and duplex ultrasound were performed to evaluate the upper extremity deep venous system from the level of the subclavian vein and including the jugular, axillary, basilic, radial, ulnar and upper cephalic vein. Spectral Doppler was utilized to evaluate flow at rest and with distal augmentation maneuvers. COMPARISON:  None. FINDINGS: Contralateral Subclavian Vein: Respiratory phasicity is normal and symmetric with the symptomatic side. No evidence of thrombus. Normal compressibility. Internal Jugular Vein: No evidence of thrombus. Normal compressibility, respiratory phasicity and response to augmentation. Subclavian Vein: No evidence of thrombus. Normal compressibility, respiratory phasicity and response to augmentation. Axillary Vein: No evidence of thrombus. Normal compressibility, respiratory phasicity and response to augmentation. Cephalic Vein:  No evidence of thrombus. Normal compressibility, respiratory phasicity and response to augmentation. Basilic Vein: No evidence of thrombus. Normal compressibility, respiratory phasicity and response to augmentation. Brachial Veins: No evidence of thrombus. Normal compressibility, respiratory phasicity and response to augmentation. Radial Veins: No evidence of thrombus. Normal compressibility, respiratory phasicity and response to augmentation. Ulnar Veins: No evidence of thrombus. Normal compressibility, respiratory phasicity and response to augmentation. Venous Reflux:  None visualized. Other Findings:  Mild right arm subcutaneous edema. IMPRESSION: No evidence of deep venous thrombosis. Electronically Signed   By: Judie Petit.  Shick M.D.   On: 01/12/2017 11:39        Scheduled Meds: . ketorolac  30 mg Intravenous Q6H  . piperacillin-tazobactam  3.375 g Intravenous Once  . vancomycin  1,000 mg Intravenous Once   Continuous Infusions: . 0.45 % NaCl with KCl 20 mEq / L 100 mL/hr at  01/13/17 0852     LOS: 1 day    Time spent:    Elick Aguilera, MD [facility] Pager [phone]  If 7PM-7AM, please contact night-coverage [email or web address] Password [facility] 01/13/2017, 12:04 PM

## 2017-01-13 NOTE — Progress Notes (Signed)
Pt requesting more pain medication. Adv her next dose is not until 12:41pm, it is every 4hours PRN. Pt stated that it is not working for her and she needs something else for pain. Dr. Kerry HoughMemon paged, and made aware. Waiting for call back.

## 2017-01-13 NOTE — Progress Notes (Signed)
Pharmacy Antibiotic Note  [name] is [name] 31 y.o. female admitted on 01/11/2017 with cellulitis.  [facility]  Plan:  Vancomycin 1000mg  IV q12h Check trough at steady state Zosyn 3.375gm IV q8h, EID Monitor labs, renal fxn, progress and c/s Deescalate ABX when improved / appropriate.    Height: 5' (152.4 cm) Weight: 160 lb 15 oz (73 kg) IBW/kg (Calculated) : 45.5  Temp (24hrs), Avg:98.5 F (36.9 C), Min:98.4 F (36.9 C), Max:98.6 F (37 C)   Recent Labs Lab 01/09/17 1915 01/12/17 0031 01/12/17 0040 01/13/17 0701  WBC 5.5 11.6*  --  10.3  CREATININE  --  1.03*  --  0.68  LATICACIDVEN  --   --  1.29  --     Estimated Creatinine Clearance: 90 mL/min (by C-G formula based on SCr of 0.68 mg/dL).    No Known Allergies  Antimicrobials this admission: Vancomycin 1/28 >>  Zosyn 1/28 >>   Dose adjustments this admission:  Microbiology results:  BCx: pending  UCx: pending   Sputum:    MRSA PCR:   Thank you for allowing pharmacy to be [name] part of this patient's care.  Valrie HartHall, [name] 01/13/2017 1:11 PM

## 2017-01-14 ENCOUNTER — Inpatient Hospital Stay (HOSPITAL_COMMUNITY): Payer: Medicaid Other

## 2017-01-14 LAB — RETICULOCYTES
RBC.: 2.73 MIL/uL — AB (ref 3.87–5.11)
RETIC CT PCT: 0.5 % (ref 0.4–3.1)
Retic Count, Absolute: 13.7 10*3/uL — ABNORMAL LOW (ref 19.0–186.0)

## 2017-01-14 LAB — IRON AND TIBC
Iron: 16 ug/dL — ABNORMAL LOW (ref 28–170)
Saturation Ratios: 8 % — ABNORMAL LOW (ref 10.4–31.8)
TIBC: 210 ug/dL — ABNORMAL LOW (ref 250–450)
UIBC: 194 ug/dL

## 2017-01-14 LAB — CBC
HCT: 21.2 % — ABNORMAL LOW (ref 36.0–46.0)
Hemoglobin: 7.1 g/dL — ABNORMAL LOW (ref 12.0–15.0)
MCH: 26 pg (ref 26.0–34.0)
MCHC: 33.5 g/dL (ref 30.0–36.0)
MCV: 77.7 fL — ABNORMAL LOW (ref 78.0–100.0)
PLATELETS: 350 10*3/uL (ref 150–400)
RBC: 2.73 MIL/uL — AB (ref 3.87–5.11)
RDW: 17.2 % — AB (ref 11.5–15.5)
WBC: 9.8 10*3/uL (ref 4.0–10.5)

## 2017-01-14 LAB — FERRITIN: FERRITIN: 115 ng/mL (ref 11–307)

## 2017-01-14 LAB — BASIC METABOLIC PANEL
Anion gap: 8 (ref 5–15)
BUN: 12 mg/dL (ref 6–20)
CALCIUM: 8 mg/dL — AB (ref 8.9–10.3)
CO2: 20 mmol/L — ABNORMAL LOW (ref 22–32)
CREATININE: 0.67 mg/dL (ref 0.44–1.00)
Chloride: 112 mmol/L — ABNORMAL HIGH (ref 101–111)
GFR calc Af Amer: >60 mL/min (ref >60)
Glucose, Bld: 105 mg/dL — ABNORMAL HIGH (ref 65–99)
Potassium: 4.1 mmol/L (ref 3.5–5.1)
SODIUM: 140 mmol/L (ref 135–145)

## 2017-01-14 LAB — FOLATE: Folate: 5.9 ng/mL — ABNORMAL LOW (ref >5.9)

## 2017-01-14 LAB — VITAMIN B12: VITAMIN B 12: 1136 pg/mL — AB (ref 180–914)

## 2017-01-14 MED ORDER — SODIUM CHLORIDE 0.9 % IV SOLN
510.0000 mg | Freq: Once | INTRAVENOUS | Status: AC
  Administered 2017-01-14: 510 mg via INTRAVENOUS
  Filled 2017-01-14: qty 17

## 2017-01-14 MED ORDER — IOPAMIDOL (ISOVUE-300) INJECTION 61%
100.0000 mL | Freq: Once | INTRAVENOUS | Status: AC | PRN
  Administered 2017-01-14: 100 mL via INTRAVENOUS

## 2017-01-14 NOTE — Progress Notes (Addendum)
PROGRESS NOTE    [name]  ZOX:[id number] DOB: 18-Sep-1986 DOA: 01/11/2017 PCP: [facility]    Brief Narrative:  31 y/o female who recently delivered her 3rd child 6 weeks ago, presented with cellulitis of right arm. She was treated in ED and sent home with clindamycin, but failed treatment and required admission. She is currently on IV antibiotics.   Assessment & Plan:   Principal Problem:   Cellulitis Active Problems:   Postpartum state   Normocytic anemia   Metabolic acidosis   Hypokalemia   Cellulitis of right upper extremity   AKI (acute kidney injury) (HCC)   1. Cellulitis of right upper extremity. Felt to be related to recent IV in that arm. No evidence of DVT on imaging. Initially started on clindamycin without improvement. Antibiotics changed to vancomycin and zosyn. Cellulitis has not had significant improvement. Will check CT of right upper extremity to rule out underlying abscess and consult orthopedics.  2. Normocytic anemia. Anemia panel indicates iron deficiency. Will give one dose of IV iron. No evidence of ongoing bleeding. Unclear where her baseline hemoglobin runs. Will transfuse for hemoglobin <7.   3. Hypokalemia. improved with replacement  4. Non gap Metabolic acidosis. Likely related to hyperchloremia. Continue hypotonic fluids. Improving.  5. AKI. Related to dehydration. Improved with fluid hydration. Creatinine was 1.03 on admission and down to 0.6 with hydration   DVT prophylaxis: scd Code Status: full Family Communication: discussed with patient Disposition Plan: discharge home once improved   Consultants:     Procedures:     Antimicrobials:   Clindamycin 1/27>>1/28  Vancomycin 1/28>>  Zosyn 1/28>>   Subjective: Continued pain in forearm and hand without improvement  Objective: Vitals:   01/13/17 1356 01/13/17 2125 01/14/17 0500 01/14/17 1335  BP: 137/85 (!) 149/85 (!) 164/97 (!) 152/81  Pulse: 87 85 83 82    Resp: 18 18 18 20   Temp: 98.2 F (36.8 C) 98.2 F (36.8 C) 98.5 F (36.9 C) 97.5 F (36.4 C)  TempSrc: Oral Oral Oral Oral  SpO2: 100% 100% 100% 100%  Weight:      Height:        Intake/Output Summary (Last 24 hours) at 01/14/17 1442 Last data filed at 01/14/17 1336  Gross per 24 hour  Intake          3041.67 ml  Output                0 ml  Net          3041.67 ml   Filed Weights   01/11/17 2110 01/12/17 0300  Weight: 72.6 kg (160 lb) 73 kg (160 lb 15 oz)    Examination:  General exam: Appears calm and comfortable  Respiratory system: Clear to auscultation. Respiratory effort normal. Cardiovascular system: S1 & S2 heard, RRR. No JVD, murmurs, rubs, gallops or clicks. No pedal edema. Gastrointestinal system: Abdomen is nondistended, soft and nontender. No organomegaly or masses felt. Normal bowel sounds heard. Central nervous system: Alert and oriented. No focal neurological deficits. Extremities: Symmetric 5 x 5 power. Skin: right hand/forearm still erythematous, edematous and tender to touch, small area of blistering noted on forearm Psychiatry: Judgement and insight appear normal. Mood & affect appropriate.     Data Reviewed: I have personally reviewed following labs and imaging studies  CBC:  Recent Labs Lab 01/09/17 1915 01/12/17 0031 01/13/17 0701 01/14/17 0632  WBC 5.5 11.6* 10.3 9.8  NEUTROABS 4.6 7.2  --   --  HGB 9.6* 9.2* 7.4* 7.1*  HCT 28.6* 26.7* 22.0* 21.2*  MCV 78.1 76.3* 76.4* 77.7*  PLT 201 253 310 350   Basic Metabolic Panel:  Recent Labs Lab 01/12/17 0031 01/13/17 0701 01/14/17 0632  NA 141 139 140  K 3.3* 3.8 4.1  CL 112* 114* 112*  CO2 17* 17* 20*  GLUCOSE 106* 91 105*  BUN 45* 24* 12  CREATININE 1.03* 0.68 0.67  CALCIUM 8.4* 8.1* 8.0*   GFR: Estimated Creatinine Clearance: 90 mL/min (by C-G formula based on SCr of 0.67 mg/dL). Liver Function Tests: No results for input(s): AST, ALT, ALKPHOS, BILITOT, PROT, ALBUMIN in the  last 168 hours. No results for input(s): LIPASE, AMYLASE in the last 168 hours. No results for input(s): AMMONIA in the last 168 hours. Coagulation Profile: No results for input(s): INR, PROTIME in the last 168 hours. Cardiac Enzymes: No results for input(s): CKTOTAL, CKMB, CKMBINDEX, TROPONINI in the last 168 hours. BNP (last 3 results) No results for input(s): PROBNP in the last 8760 hours. HbA1C: No results for input(s): HGBA1C in the last 72 hours. CBG: No results for input(s): GLUCAP in the last 168 hours. Lipid Profile: No results for input(s): CHOL, HDL, LDLCALC, TRIG, CHOLHDL, LDLDIRECT in the last 72 hours. Thyroid Function Tests: No results for input(s): TSH, T4TOTAL, FREET4, T3FREE, THYROIDAB in the last 72 hours. Anemia Panel:  Recent Labs  01/14/17 0632  VITAMINB12 1,136*  FOLATE 5.9*  FERRITIN 115  TIBC 210*  IRON 16*  RETICCTPCT 0.5   Sepsis Labs:  Recent Labs Lab 01/12/17 0040  LATICACIDVEN 1.29    Recent Results (from the past 240 hour(s))  Culture, blood (Routine X 2) w Reflex to ID Panel     Status: None (Preliminary result)   Collection Time: 01/11/17 12:45 AM  Result Value Ref Range Status   Specimen Description LEFT ANTECUBITAL  Final   Special Requests   Final    BOTTLES DRAWN AEROBIC AND ANAEROBIC 6CC DRAWN BY RN   Culture NO GROWTH 2 DAYS  Final   Report Status PENDING  Incomplete  Culture, blood (Routine X 2) w Reflex to ID Panel     Status: None (Preliminary result)   Collection Time: 01/12/17 12:31 AM  Result Value Ref Range Status   Specimen Description WRIST  Final   Special Requests   Final    BOTTLES DRAWN AEROBIC AND ANAEROBIC 6CC DRAWN BY RN   Culture NO GROWTH 2 DAYS  Final   Report Status PENDING  Incomplete  MRSA PCR Screening     Status: None   Collection Time: 01/12/17  3:05 AM  Result Value Ref Range Status   MRSA by PCR NEGATIVE NEGATIVE Final    Comment:        The GeneXpert MRSA Assay (FDA approved for NASAL  specimens only), is one component of a comprehensive MRSA colonization surveillance program. It is not intended to diagnose MRSA infection nor to guide or monitor treatment for MRSA infections.          Radiology Studies: No results found.      Scheduled Meds: . piperacillin-tazobactam (ZOSYN)  IV  3.375 g Intravenous Q8H  . vancomycin  1,000 mg Intravenous Q12H   Continuous Infusions: . 0.45 % NaCl with KCl 20 mEq / L 100 mL/hr at 01/14/17 0526     LOS: 2 days    Time spent: 25mins    MEMON,JEHANZEB, MD [facility] Pager [phone]  If 7PM-7AM, please contact night-coverage [email or web address] Password  [email or web address] 01/14/2017, 2:42 PM

## 2017-01-14 NOTE — Care Management Note (Signed)
Case Management Note  Patient Details  Name: Lisa SchoolingShannon M Adkison MRN: [id number] Date of Birth: 04/15/1986  Subjective/Objective:                  Pt admitted with cellulitis. Chart reviewed for CM needs. Pt is from home with family. She is ind with ADl's. She has medicaid that covers medications. [facility] She plans to return home with self care at [place].   Action/Plan: No CM needs anticipated. Will cont to follow.   Expected Discharge Date:    01/18/2016              Expected Discharge Plan:  Home/Self Care  In-House Referral:  NA  Discharge planning Services  CM Consult  Post Acute Care Choice:  NA Choice offered to:  NA  Status of Service:  In process, will continue to follow  Malcolm MetroChildress, Tomislav Micale Demske, RN 01/14/2017, 3:34 PM

## 2017-01-15 LAB — BASIC METABOLIC PANEL
Anion gap: 7 (ref 5–15)
BUN: 6 mg/dL (ref 6–20)
CHLORIDE: 107 mmol/L (ref 101–111)
CO2: 21 mmol/L — AB (ref 22–32)
CREATININE: 0.48 mg/dL (ref 0.44–1.00)
Calcium: 8.3 mg/dL — ABNORMAL LOW (ref 8.9–10.3)
GFR calc non Af Amer: >60 mL/min (ref >60)
GLUCOSE: 90 mg/dL (ref 65–99)
Potassium: 3.8 mmol/L (ref 3.5–5.1)
Sodium: 135 mmol/L (ref 135–145)

## 2017-01-15 LAB — CBC
HCT: 22.4 % — ABNORMAL LOW (ref 36.0–46.0)
HEMOGLOBIN: 7.5 g/dL — AB (ref 12.0–15.0)
MCH: 26 pg (ref 26.0–34.0)
MCHC: 33.5 g/dL (ref 30.0–36.0)
MCV: 77.5 fL — ABNORMAL LOW (ref 78.0–100.0)
Platelets: 435 10*3/uL — ABNORMAL HIGH (ref 150–400)
RBC: 2.89 MIL/uL — ABNORMAL LOW (ref 3.87–5.11)
RDW: 17.2 % — ABNORMAL HIGH (ref 11.5–15.5)
WBC: 9.7 10*3/uL (ref 4.0–10.5)

## 2017-01-15 NOTE — Progress Notes (Addendum)
PROGRESS NOTE    [name]  ZOX:[id number] DOB: 1986/03/17 DOA: 01/11/2017 PCP: [facility]    Brief Narrative:  31 y/o female who recently delivered her 3rd child 6 weeks ago, presented with cellulitis of right arm. She was treated in ED and sent home with clindamycin, but failed outpatient treatment and required admission. She has been on IV antibiotics due to lack of improvement, CT scan was done yesterday which did not identify any underlying abscess. Orthopedics is following. She is slowly improving. Anticipate another 1-2 days in the hospital.   Assessment & Plan:   Principal Problem:   Cellulitis Active Problems:   Postpartum state   Normocytic anemia   Metabolic acidosis   Hypokalemia   Cellulitis of right upper extremity   AKI (acute kidney injury) (HCC)   1. Cellulitis of right upper extremity. Felt to be related to recent IV in that arm. No evidence of DVT on imaging. Initially started on clindamycin without improvement. Antibiotics changed to vancomycin and zosyn. CT of extremity does not show abscess. Appreciate orthopedics input. Monitoring for development of compartment syndrome. Overall, clinically she appears to be improving.  2. Normocytic anemia. Anemia panel indicates iron deficiency. She received one dose of IV iron. No evidence of ongoing bleeding. Unclear where her baseline hemoglobin runs. Will transfuse for hemoglobin <7.  She does admit to taking naproxen. Check stool for occult blood. If positive, may need to be started on PPI  3. Hypokalemia. improved with replacement  4. Non gap Metabolic acidosis. Likely related to hyperchloremia. Continue hypotonic fluids. Improving.  5. AKI. Related to dehydration. Improved with fluid hydration. Creatinine was 1.03 on admission and down to 0.6 with hydration   DVT prophylaxis: scd Code Status: full Family Communication: discussed with patient Disposition Plan: discharge home once  improved   Consultants:   ortho  Procedures:     Antimicrobials:   Clindamycin 1/27>>1/28  Vancomycin 1/28>>  Zosyn 1/28>>   Subjective: Feels that erythema may be a little better today. Continues to complain of pain.  Objective: Vitals:   01/14/17 0500 01/14/17 1335 01/14/17 2150 01/15/17 0608  BP: (!) 164/97 (!) 152/81 (!) 157/83 (!) 158/89  Pulse: 83 82 93 90  Resp: 18 20 20 20   Temp: 98.5 F (36.9 C) 97.5 F (36.4 C) 99.1 F (37.3 C) 98.6 F (37 C)  TempSrc: Oral Oral Oral Oral  SpO2: 100% 100% 99% 100%  Weight:      Height:        Intake/Output Summary (Last 24 hours) at 01/15/17 1229 Last data filed at 01/15/17 0900  Gross per 24 hour  Intake          5008.33 ml  Output                0 ml  Net          5008.33 ml   Filed Weights   01/11/17 2110 01/12/17 0300  Weight: 72.6 kg (160 lb) 73 kg (160 lb 15 oz)    Examination:  General exam: Appears calm and comfortable  Respiratory system: Clear to auscultation. Respiratory effort normal. Cardiovascular system: S1 & S2 heard, RRR. No JVD, murmurs, rubs, gallops or clicks. No pedal edema. Gastrointestinal system: Abdomen is nondistended, soft and nontender. No organomegaly or masses felt. Normal bowel sounds heard. Central nervous system: Alert and oriented. No focal neurological deficits. Extremities: Symmetric 5 x 5 power. Skin: right hand/forearm with mildly improved erythema and swelling. Still tender  on palpation Psychiatry: Judgement and insight appear normal. Mood & affect appropriate.     Data Reviewed: I have personally reviewed following labs and imaging studies  CBC:  Recent Labs Lab 01/09/17 1915 01/12/17 0031 01/13/17 0701 01/14/17 0632 01/15/17 0624  WBC 5.5 11.6* 10.3 9.8 9.7  NEUTROABS 4.6 7.2  --   --   --   HGB 9.6* 9.2* 7.4* 7.1* 7.5*  HCT 28.6* 26.7* 22.0* 21.2* 22.4*  MCV 78.1 76.3* 76.4* 77.7* 77.5*  PLT 201 253 310 350 435*   Basic Metabolic Panel:  Recent  Labs Lab 01/12/17 0031 01/13/17 0701 01/14/17 0632 01/15/17 0624  NA 141 139 140 135  K 3.3* 3.8 4.1 3.8  CL 112* 114* 112* 107  CO2 17* 17* 20* 21*  GLUCOSE 106* 91 105* 90  BUN 45* 24* 12 6  CREATININE 1.03* 0.68 0.67 0.48  CALCIUM 8.4* 8.1* 8.0* 8.3*   GFR: Estimated Creatinine Clearance: 90 mL/min (by C-G formula based on SCr of 0.48 mg/dL). Liver Function Tests: No results for input(s): AST, ALT, ALKPHOS, BILITOT, PROT, ALBUMIN in the last 168 hours. No results for input(s): LIPASE, AMYLASE in the last 168 hours. No results for input(s): AMMONIA in the last 168 hours. Coagulation Profile: No results for input(s): INR, PROTIME in the last 168 hours. Cardiac Enzymes: No results for input(s): CKTOTAL, CKMB, CKMBINDEX, TROPONINI in the last 168 hours. BNP (last 3 results) No results for input(s): PROBNP in the last 8760 hours. HbA1C: No results for input(s): HGBA1C in the last 72 hours. CBG: No results for input(s): GLUCAP in the last 168 hours. Lipid Profile: No results for input(s): CHOL, HDL, LDLCALC, TRIG, CHOLHDL, LDLDIRECT in the last 72 hours. Thyroid Function Tests: No results for input(s): TSH, T4TOTAL, FREET4, T3FREE, THYROIDAB in the last 72 hours. Anemia Panel:  Recent Labs  01/14/17 0632  VITAMINB12 1,136*  FOLATE 5.9*  FERRITIN 115  TIBC 210*  IRON 16*  RETICCTPCT 0.5   Sepsis Labs:  Recent Labs Lab 01/12/17 0040  LATICACIDVEN 1.29    Recent Results (from the past 240 hour(s))  Culture, blood (Routine X 2) w Reflex to ID Panel     Status: None (Preliminary result)   Collection Time: 01/11/17 12:45 AM  Result Value Ref Range Status   Specimen Description LEFT ANTECUBITAL  Final   Special Requests   Final    BOTTLES DRAWN AEROBIC AND ANAEROBIC 6CC DRAWN BY RN   Culture NO GROWTH 3 DAYS  Final   Report Status PENDING  Incomplete  Culture, blood (Routine X 2) w Reflex to ID Panel     Status: None (Preliminary result)   Collection Time:  01/12/17 12:31 AM  Result Value Ref Range Status   Specimen Description WRIST  Final   Special Requests   Final    BOTTLES DRAWN AEROBIC AND ANAEROBIC 6CC DRAWN BY RN   Culture NO GROWTH 3 DAYS  Final   Report Status PENDING  Incomplete  MRSA PCR Screening     Status: None   Collection Time: 01/12/17  3:05 AM  Result Value Ref Range Status   MRSA by PCR NEGATIVE NEGATIVE Final    Comment:        The GeneXpert MRSA Assay (FDA approved for NASAL specimens only), is one component of a comprehensive MRSA colonization surveillance program. It is not intended to diagnose MRSA infection nor to guide or monitor treatment for MRSA infections.          Radiology  Studies: Ct Extrem Up Entire Arm R W/cm  Result Date: 01/14/2017 CLINICAL DATA:  Cellulitis, swelling of the right arm. EXAM: CT OF THE UPPER RIGHT EXTREMITY WITH CONTRAST TECHNIQUE: Multidetector CT imaging of the upper right extremity was performed according to the standard protocol following intravenous contrast administration. COMPARISON:  None. CONTRAST:  ISOVUE-300 IOPAMIDOL (ISOVUE-300) INJECTION 61%<Contrast>151mL ISOVUE-300 IOPAMIDOL (ISOVUE-300) INJECTION 61% FINDINGS: Bones/Joint/Cartilage No fracture or dislocation. No periosteal reaction or bone destruction. No lytic or sclerotic osseous lesion. No joint effusion. Ligaments Suboptimally assessed by CT. Muscles and Tendons Muscles are normal. No intramuscular fluid collection. No muscle atrophy. Soft tissues Soft tissue edema in the distal aspect of the right upper arm extending into the forearm and into the dorsal aspect of the hand most concerning for cellulitis. No drainable fluid collection. IMPRESSION: 1. Soft tissue edema in the distal aspect of the right upper arm extending into the forearm and into the dorsal aspect of the hand most concerning for cellulitis. No drainable fluid collection to suggest an abscess. Electronically Signed   By: Elige Ko   On:  01/14/2017 19:38        Scheduled Meds: . piperacillin-tazobactam (ZOSYN)  IV  3.375 g Intravenous Q8H  . vancomycin  1,000 mg Intravenous Q12H   Continuous Infusions: . 0.45 % NaCl with KCl 20 mEq / L 100 mL/hr at 01/14/17 1657     LOS: 3 days    Time spent:    MEMON,JEHANZEB, MD [facility] Pager [phone]  If 7PM-7AM, please contact night-coverage [email or web address] Password [facility] 01/15/2017, 12:29 PM

## 2017-01-15 NOTE — Consult Note (Signed)
Patient ID: [name], female   DOB: 1986-08-04, 31 y.o.   MRN: [id number]  CONSULT [id number] Detailed history Detailed exam Low complexity  Requested by Dr. Veneda Melter  Reason for consultation swelling right upper extremity  Chief Complaint  Patient presents with  . Arm Swelling    HPI [name] is a 31 y.o. female.  This is a 31 year old female recently postpartum who was at [facility] released and developed swelling in her right forearm from the elbow down to the wrist. She presented back to the ER they sent her home after evaluation and treatment with antibiotics for presumed cellulitis. She did not improve and came to this ER for further evaluation and management  Location-right forearm Duration-started last Friday Severity-8 out of 10 Quality-dull ache throbbing Modified by-erythema has improved with antibiotics but the swelling has not even with elevation   Review of Systems (at least 2) Review of Systems  Constitutional: Negative for chills and fever.  Neurological: Negative for weakness and numbness.   The patient reports no paresthesias on the volar or dorsal side of the forearm hand or wrist    History reviewed. No pertinent past medical history. Such as hypertension or diabetes  Past Surgical History:  Procedure Laterality Date  . TONSILLECTOMY AND ADENOIDECTOMY       Social History  Social History  Substance Use Topics  . Smoking status: Current Every Day Smoker    Packs/day: 0.50    Years: 21.00    Types: Cigarettes  . Smokeless tobacco: Never Used  . Alcohol use No     Comment: not now    No Known Allergies  Current Facility-Administered Medications  Medication Dose Route Frequency Provider Last Rate Last Dose  . 0.45 % NaCl with KCl 20 mEq / L infusion   Intravenous Continuous Erick Blinks, MD 100 mL/hr at 01/14/17 1657    . oxyCODONE-acetaminophen (PERCOCET/ROXICET) 5-325 MG per tablet 1-2 tablet  1-2 tablet Oral Q4H PRN  Erick Blinks, MD   2 tablet at 01/14/17 2318  . piperacillin-tazobactam (ZOSYN) IVPB 3.375 g  3.375 g Intravenous Q8H Erick Blinks, MD   3.375 g at 01/15/17 0512  . vancomycin (VANCOCIN) IVPB 1000 mg/200 mL premix  1,000 mg Intravenous Q12H Erick Blinks, MD   1,000 mg at 01/15/17 0000      Physical Exam-Detailed Physical Exam  Blood pressure (!) 158/89, pulse 90, temperature 98.6 F (37 C), temperature source Oral, resp. rate 20, height 5' (1.524 m), weight 160 lb 15 oz (73 kg), last menstrual period 04/30/2016, SpO2 100 %. Gen. appearance Normal in appearance grooming and hygiene Cardiovascular exam the pulses are 2+ in the right upper extremity is normal capillary refill and color  The ambulatory status is noncontributory but reported as normal  Extremity examined right upper extremity  Inspection Range of motion assessment the patient has full passive range of motion of the wrist and elbow. She has no pain on passive stretch extension of the wrist or flexion of the wrist  However she does have mild tenderness on the extensor compartment dorsally and moderate tenderness on the flexor compartment volarly Stability assessment stability test reveal no instability or laxity in the wrist joint Muscle tone are normal with no atrophy or tremors Skin there are no scars rashes lesions or lacerations, however there is erythema mild on the dorsum of the forearm more pronounced on the volar aspect of the forearm  Sensation to touch is normal radial ulnar and median nerve  The patient is oriented to person place and time The patient's mood and affect show no depression or anxiety or agitation  For comparison the opposite extremity and left upper extremity was examined and the following findings were noted  Normal range of motion stability strength and alignment no erythema tenderness or swelling  MEDICAL DECISION MAKING (minimum/low)  Data Reviewed  I have personally reviewed the  imaging studies and the report and my interpretation is:  CT scan showed no collection of fluid but soft tissue swelling in the forearm  Assessment    Cellulitis right forearm Ruled out for compartment syndrome with clinical exam but needs further monitoring    Plan    Continue IV antibiotics and monitor for compartment syndrome       Fuller CanadaStanley Kitai Purdom 01/15/2017, 8:11 AM   Vickki HearingStanley E Noriel Guthrie MD

## 2017-01-16 DIAGNOSIS — L03113 Cellulitis of right upper limb: Secondary | ICD-10-CM

## 2017-01-16 LAB — CBC
HCT: 25.2 % — ABNORMAL LOW (ref 36.0–46.0)
Hemoglobin: 8.1 g/dL — ABNORMAL LOW (ref 12.0–15.0)
MCH: 25.1 pg — ABNORMAL LOW (ref 26.0–34.0)
MCHC: 32.1 g/dL (ref 30.0–36.0)
MCV: 78 fL (ref 78.0–100.0)
PLATELETS: 485 10*3/uL — AB (ref 150–400)
RBC: 3.23 MIL/uL — AB (ref 3.87–5.11)
RDW: 16.4 % — AB (ref 11.5–15.5)
WBC: 8.5 10*3/uL (ref 4.0–10.5)

## 2017-01-16 LAB — BASIC METABOLIC PANEL
Anion gap: 8 (ref 5–15)
BUN: 5 mg/dL — ABNORMAL LOW (ref 6–20)
CALCIUM: 8.3 mg/dL — AB (ref 8.9–10.3)
CHLORIDE: 104 mmol/L (ref 101–111)
CO2: 23 mmol/L (ref 22–32)
CREATININE: 0.49 mg/dL (ref 0.44–1.00)
GFR calc Af Amer: >60 mL/min (ref >60)
Glucose, Bld: 93 mg/dL (ref 65–99)
Potassium: 3.6 mmol/L (ref 3.5–5.1)
SODIUM: 135 mmol/L (ref 135–145)

## 2017-01-16 LAB — VANCOMYCIN, TROUGH: Vancomycin Tr: 12 ug/mL — ABNORMAL LOW (ref 15–20)

## 2017-01-16 NOTE — Progress Notes (Signed)
Pharmacy Antibiotic Note  [name] is a 31 y.o. female admitted on 01/11/2017 with cellulitis.  [facility].  Trough level = 12, on target for diagnosis.  SCr stable.  Plan:  Continue Vancomycin 1000mg  IV q12h Check trough at steady state Zosyn 3.375gm IV q8h, EID Monitor labs, renal fxn, progress and c/s Deescalate ABX when improved / appropriate.    Height: 5' (152.4 cm) Weight: 160 lb 15 oz (73 kg) IBW/kg (Calculated) : 45.5  Temp (24hrs), Avg:98.9 F (37.2 C), Min:98 F (36.7 C), Max:100.3 F (37.9 C)   Recent Labs Lab 01/12/17 0031 01/12/17 0040 01/13/17 0701 01/14/17 [id number] 01/15/17 0624 01/16/17 0612 01/16/17 0904  WBC 11.6*  --  10.3 9.8 9.7 8.5  --   CREATININE 1.03*  --  0.68 0.67 0.48 0.49  --   LATICACIDVEN  --  1.29  --   --   --   --   --   VANCOTROUGH  --   --   --   --   --   --  12*    Estimated Creatinine Clearance: 90 mL/min (by C-G formula based on SCr of 0.49 mg/dL).    No Known Allergies  Antimicrobials this admission: Vancomycin 1/28 >>  Zosyn 1/28 >>   Dose adjustments this admission:  Microbiology results:  BCx: pending  UCx: pending   Sputum:    MRSA PCR:   Thank you for allowing pharmacy to be a part of this patient's care.  Valrie HartHall, Alpha Mysliwiec A 01/16/2017 10:55 AM

## 2017-01-16 NOTE — Progress Notes (Signed)
Patient ID: Lisa SchoolingShannon M Pollino, female   DOB: [birth year], 31 y.o.   MRN: [id number] Consult follow-up orthopedics  The patient's swelling has gone down she continues to exhibit normal neurovascular function to the right hand with negative pain on passive stretch of each compartment and the compartments feel soft. There is good color and capillary refill as well.  The patient will continue ice and elevation appropriate antibiotics and we will continue to monitor daily

## 2017-01-16 NOTE — Plan of Care (Signed)
Problem: Skin Integrity: Goal: Risk for impaired skin integrity will decrease Outcome: Progressing Cellulitis improving

## 2017-01-16 NOTE — Progress Notes (Signed)
PROGRESS NOTE    [name]  ZOX:[id number] DOB: 1986-02-16 DOA: 01/11/2017 PCP: [facility]    Brief Narrative:  31 y/o female who recently delivered her 3rd child 6 weeks ago, presented with cellulitis of right arm. She was treated in ED and sent home with clindamycin, but failed outpatient treatment and required admission. She has been on IV antibiotics due to lack of improvement, CT scan was done yesterday which did not identify any underlying abscess. Orthopedics is following. She is slowly improving. Anticipate another 1-2 days in the hospital.   Assessment & Plan:   Principal Problem:   Cellulitis Active Problems:   Postpartum state   Normocytic anemia   Metabolic acidosis   Hypokalemia   Cellulitis of right upper extremity   AKI (acute kidney injury) (HCC)   1. Cellulitis of right upper extremity. Felt to be related to recent IV in that arm. No evidence of DVT on imaging. Initially started on clindamycin without improvement. Antibiotics changed to vancomycin and zosyn. CT of extremity does not show abscess. Appreciate orthopedics input. Still swollen but limited concern for compartment syndrome.  improving.  2. Normocytic anemia. Anemia panel indicates iron deficiency. She received one dose of IV iron. No evidence of ongoing bleeding. Unclear where her baseline hemoglobin runs. Will transfuse for hemoglobin <7.  She does admit to taking naproxen.  stool for occult blood is pending. If positive, may need to be started on PPI  3. Hypokalemia. improved with replacement  4. Non gap Metabolic acidosis. Likely related to hyperchloremia. Continue hypotonic fluids. Improving.  5. AKI. Related to dehydration. Improved with fluid hydration. Creatinine was 1.03 on admission and down to 0.6 with hydration   DVT prophylaxis: scd Code Status: full Family Communication: discussed with patient Disposition Plan: discharge home once improved   Consultants:    ortho  Procedures:     Antimicrobials:   Clindamycin 1/27>>1/28  Vancomycin 1/28>>  Zosyn 1/28>>   Subjective:  Pain no n/v no cp No fever nor chills  Objective: Vitals:   01/15/17 0608 01/15/17 1328 01/15/17 2213 01/16/17 0640  BP: (!) 158/89 (!) 147/93 (!) 173/98 (!) 147/98  Pulse: 90 91 (!) 116 96  Resp: 20 20 20 20   Temp: 98.6 F (37 C) 98.4 F (36.9 C) 100.3 F (37.9 C) 98 F (36.7 C)  TempSrc: Oral Oral Oral Oral  SpO2: 100% 100% 100% 100%  Weight:      Height:        Intake/Output Summary (Last 24 hours) at 01/16/17 1133 Last data filed at 01/16/17 0800  Gross per 24 hour  Intake             1490 ml  Output                0 ml  Net             1490 ml   Filed Weights   01/11/17 2110 01/12/17 0300  Weight: 72.6 kg (160 lb) 73 kg (160 lb 15 oz)    Examination:  General exam: Appears calm and comfortable  Respiratory system: Clear to auscultation. Respiratory effort normal. Cardiovascular system: S1 & S2 heard, RRR. No JVD, murmurs, rubs, gallops or clicks. No pedal edema. Gastrointestinal system: Abdomen is nondistended, soft and nontender. No organomegaly or masses felt. Normal bowel sounds heard. Central nervous system: Alert and oriented. No focal neurological deficits. Extremities: Symmetric 5 x 5 power. Skin: right hand/forearm has erythema and swelling. Still tender  on palpation.  Volar flexion shows some tenderness and ROM is restricted Psychiatry: Judgement and insight appear normal. Mood & affect appropriate.     Data Reviewed: I have personally reviewed following labs and imaging studies  CBC:  Recent Labs Lab 01/09/17 1915 01/12/17 0031 01/13/17 0701 01/14/17 4098 01/15/17 0624 01/16/17 0612  WBC 5.5 11.6* 10.3 9.8 9.7 8.5  NEUTROABS 4.6 7.2  --   --   --   --   HGB 9.6* 9.2* 7.4* 7.1* 7.5* 8.1*  HCT 28.6* 26.7* 22.0* 21.2* 22.4* 25.2*  MCV 78.1 76.3* 76.4* 77.7* 77.5* 78.0  PLT 201 253 310 350 435* 485*   Basic  Metabolic Panel:  Recent Labs Lab 01/12/17 0031 01/13/17 0701 01/14/17 0632 01/15/17 0624 01/16/17 0612  NA 141 139 140 135 135  K 3.3* 3.8 4.1 3.8 3.6  CL 112* 114* 112* 107 104  CO2 17* 17* 20* 21* 23  GLUCOSE 106* 91 105* 90 93  BUN 45* 24* 12 6 5*  CREATININE 1.03* 0.68 0.67 0.48 0.49  CALCIUM 8.4* 8.1* 8.0* 8.3* 8.3*   GFR: Estimated Creatinine Clearance: 90 mL/min (by C-G formula based on SCr of 0.49 mg/dL). Liver Function Tests: No results for input(s): AST, ALT, ALKPHOS, BILITOT, PROT, ALBUMIN in the last 168 hours. No results for input(s): LIPASE, AMYLASE in the last 168 hours. No results for input(s): AMMONIA in the last 168 hours. Coagulation Profile: No results for input(s): INR, PROTIME in the last 168 hours. Cardiac Enzymes: No results for input(s): CKTOTAL, CKMB, CKMBINDEX, TROPONINI in the last 168 hours. BNP (last 3 results) No results for input(s): PROBNP in the last 8760 hours. HbA1C: No results for input(s): HGBA1C in the last 72 hours. CBG: No results for input(s): GLUCAP in the last 168 hours. Lipid Profile: No results for input(s): CHOL, HDL, LDLCALC, TRIG, CHOLHDL, LDLDIRECT in the last 72 hours. Thyroid Function Tests: No results for input(s): TSH, T4TOTAL, FREET4, T3FREE, THYROIDAB in the last 72 hours. Anemia Panel:  Recent Labs  01/14/17 0632  VITAMINB12 1,136*  FOLATE 5.9*  FERRITIN 115  TIBC 210*  IRON 16*  RETICCTPCT 0.5   Sepsis Labs:  Recent Labs Lab 01/12/17 0040  LATICACIDVEN 1.29    Recent Results (from the past 240 hour(s))  Culture, blood (Routine X 2) w Reflex to ID Panel     Status: None (Preliminary result)   Collection Time: 01/11/17 12:45 AM  Result Value Ref Range Status   Specimen Description LEFT ANTECUBITAL  Final   Special Requests   Final    BOTTLES DRAWN AEROBIC AND ANAEROBIC 6CC DRAWN BY RN   Culture NO GROWTH 4 DAYS  Final   Report Status PENDING  Incomplete  Culture, blood (Routine X 2) w Reflex to  ID Panel     Status: None (Preliminary result)   Collection Time: 01/12/17 12:31 AM  Result Value Ref Range Status   Specimen Description WRIST  Final   Special Requests   Final    BOTTLES DRAWN AEROBIC AND ANAEROBIC 6CC DRAWN BY RN   Culture NO GROWTH 4 DAYS  Final   Report Status PENDING  Incomplete  MRSA PCR Screening     Status: None   Collection Time: 01/12/17  3:05 AM  Result Value Ref Range Status   MRSA by PCR NEGATIVE NEGATIVE Final    Comment:        The GeneXpert MRSA Assay (FDA approved for NASAL specimens only), is one component of a comprehensive MRSA  colonization surveillance program. It is not intended to diagnose MRSA infection nor to guide or monitor treatment for MRSA infections.          Radiology Studies: Ct Extrem Up Entire Arm R W/cm  Result Date: 01/14/2017 CLINICAL DATA:  Cellulitis, swelling of the right arm. EXAM: CT OF THE UPPER RIGHT EXTREMITY WITH CONTRAST TECHNIQUE: Multidetector CT imaging of the upper right extremity was performed according to the standard protocol following intravenous contrast administration. COMPARISON:  None. CONTRAST:  100mL ISOVUE-300 IOPAMIDOL (ISOVUE-300) INJECTION 61%<Contrast>12800mL ISOVUE-300 IOPAMIDOL (ISOVUE-300) INJECTION 61% FINDINGS: Bones/Joint/Cartilage No fracture or dislocation. No periosteal reaction or bone destruction. No lytic or sclerotic osseous lesion. No joint effusion. Ligaments Suboptimally assessed by CT. Muscles and Tendons Muscles are normal. No intramuscular fluid collection. No muscle atrophy. Soft tissues Soft tissue edema in the distal aspect of the right upper arm extending into the forearm and into the dorsal aspect of the hand most concerning for cellulitis. No drainable fluid collection. IMPRESSION: 1. Soft tissue edema in the distal aspect of the right upper arm extending into the forearm and into the dorsal aspect of the hand most concerning for cellulitis. No drainable fluid collection to  suggest an abscess. Electronically Signed   By: Elige KoHetal  Patel   On: 01/14/2017 19:38        Scheduled Meds: . piperacillin-tazobactam (ZOSYN)  IV  3.375 g Intravenous Q8H  . vancomycin  1,000 mg Intravenous Q12H   Continuous Infusions: . 0.45 % NaCl with KCl 20 mEq / L 100 mL/hr at 01/16/17 0049     LOS: 4 days    Time spent: 15    Rhetta MuraSAMTANI, JAI-GURMUKH, MD [facility] Pager [phone]  If 7PM-7AM, please contact night-coverage [email or web address] Password [email or web address] 01/16/2017, 11:33 AM

## 2017-01-17 LAB — CULTURE, BLOOD (ROUTINE X 2)
CULTURE: NO GROWTH
CULTURE: NO GROWTH

## 2017-01-17 MED ORDER — DOXYCYCLINE HYCLATE 100 MG PO TABS
100.0000 mg | ORAL_TABLET | Freq: Two times a day (BID) | ORAL | Status: DC
  Administered 2017-01-17 – 2017-01-18 (×3): 100 mg via ORAL
  Filled 2017-01-17 (×3): qty 1

## 2017-01-17 NOTE — Progress Notes (Signed)
PROGRESS NOTE    [name]  VHQ:[id number] DOB: [birth year] DOA: 01/11/2017 PCP: [facility]    Brief Narrative:  31 y/o female who recently delivered her 3rd child 6 weeks ago, presented with cellulitis of right arm. She was treated in ED and sent home with clindamycin, but failed outpatient treatment and required admission. She has been on IV antibiotics due to lack of improvement, CT scan was done yesterday which did not identify any underlying abscess. Orthopedics is following. She is slowly improving. Anticipate another 1-2 days in the hospital.   Assessment & Plan:   Principal Problem:   Cellulitis Active Problems:   Postpartum state   Normocytic anemia   Metabolic acidosis   Hypokalemia   Cellulitis of right upper extremity   AKI (acute kidney injury) (HCC)   1. Cellulitis of right upper extremity. Felt to be related to recent IV in that arm. No evidence of DVT on imaging. Initially started on clindamycin without improvement. Antibiotics changed to vancomycin and zosyn. CT of extremity does not show abscess. Appreciate orthopedics input. Still swollen but limited concern for compartment syndrome.  Improving.  Will give oral abx today and observe --if better can d/c on extedned course abx on d/c  2. Normocytic anemia. Anemia panel indicates iron deficiency. She received one dose of IV iron. No evidence of ongoing bleeding. Unclear where her baseline hemoglobin runs. Will transfuse for hemoglobin <7.  She does admit to taking naproxen.  stool for occult blood is pending. If positive, may need to be started on PPI  3. Hypokalemia. improved with replacement  4. Non gap Metabolic acidosis. Likely related to hyperchloremia. Continue hypotonic fluids. Improving.  5. AKI. Related to dehydration. Improved with fluid hydration. Creatinine was 1.03 on admission and down to 0.6 with hydration   DVT prophylaxis: scd Code Status: full Family Communication: discussed  with patient Disposition Plan: discharge home once improved   Consultants:   ortho  Procedures:     Antimicrobials:   Clindamycin 1/27>>1/28  Vancomycin 1/28>>  Zosyn 1/28>>   Subjective:  Some arm pain otherwise no other concern   Objective: Vitals:   01/16/17 1300 01/16/17 2141 01/17/17 0715 01/17/17 1322  BP: 132/83 (!) 140/95 (!) 170/93 138/86  Pulse: (!) 102 (!) 105 88 89  Resp: 20 20 20 20   Temp: 98.1 F (36.7 C) 98.2 F (36.8 C) 97.9 F (36.6 C) 97.9 F (36.6 C)  TempSrc: Oral Oral Oral Oral  SpO2: 100% 97% 100% 100%  Weight:      Height:        Intake/Output Summary (Last 24 hours) at 01/17/17 1444 Last data filed at 01/17/17 1300  Gross per 24 hour  Intake              720 ml  Output                0 ml  Net              720 ml   Filed Weights   01/11/17 2110 01/12/17 0300  Weight: 72.6 kg (160 lb) 73 kg (160 lb 15 oz)    Examination:  General exam: Appears calm and comfortable  Respiratory system: Clear to auscultation. Respiratory effort normal. Cardiovascular system: S1 & S2 heard, RRR. No JVD, murmurs, rubs, gallops or clicks. No pedal edema. Gastrointestinal system: Abdomen is nondistended, soft and nontender. No organomegaly or masses felt. Normal bowel sounds heard. Central nervous system: Alert and oriented. No  focal neurological deficits. Extremities: Symmetric 5 x 5 power. Skin: right hand/forearm has erythema and swelling. Still tender on palpation.  Volar flexion shows some tenderness and ROM is restricted--it is improved since 1/31 however Psychiatry: Judgement and insight appear normal. Mood & affect appropriate.     Data Reviewed: I have personally reviewed following labs and imaging studies  CBC:  Recent Labs Lab 01/12/17 0031 01/13/17 0701 01/14/17 1610 01/15/17 0624 01/16/17 0612  WBC 11.6* 10.3 9.8 9.7 8.5  NEUTROABS 7.2  --   --   --   --   HGB 9.2* 7.4* 7.1* 7.5* 8.1*  HCT 26.7* 22.0* 21.2* 22.4* 25.2*    MCV 76.3* 76.4* 77.7* 77.5* 78.0  PLT 253 310 350 435* 485*   Basic Metabolic Panel:  Recent Labs Lab 01/12/17 0031 01/13/17 0701 01/14/17 0632 01/15/17 0624 01/16/17 0612  NA 141 139 140 135 135  K 3.3* 3.8 4.1 3.8 3.6  CL 112* 114* 112* 107 104  CO2 17* 17* 20* 21* 23  GLUCOSE 106* 91 105* 90 93  BUN 45* 24* 12 6 5*  CREATININE 1.03* 0.68 0.67 0.48 0.49  CALCIUM 8.4* 8.1* 8.0* 8.3* 8.3*   GFR: Estimated Creatinine Clearance: 90 mL/min (by C-G formula based on SCr of 0.49 mg/dL). Liver Function Tests: No results for input(s): AST, ALT, ALKPHOS, BILITOT, PROT, ALBUMIN in the last 168 hours. No results for input(s): LIPASE, AMYLASE in the last 168 hours. No results for input(s): AMMONIA in the last 168 hours. Coagulation Profile: No results for input(s): INR, PROTIME in the last 168 hours. Cardiac Enzymes: No results for input(s): CKTOTAL, CKMB, CKMBINDEX, TROPONINI in the last 168 hours. BNP (last 3 results) No results for input(s): PROBNP in the last 8760 hours. HbA1C: No results for input(s): HGBA1C in the last 72 hours. CBG: No results for input(s): GLUCAP in the last 168 hours. Lipid Profile: No results for input(s): CHOL, HDL, LDLCALC, TRIG, CHOLHDL, LDLDIRECT in the last 72 hours. Thyroid Function Tests: No results for input(s): TSH, T4TOTAL, FREET4, T3FREE, THYROIDAB in the last 72 hours. Anemia Panel: No results for input(s): VITAMINB12, FOLATE, FERRITIN, TIBC, IRON, RETICCTPCT in the last 72 hours. Sepsis Labs:  Recent Labs Lab 01/12/17 0040  LATICACIDVEN 1.29    Recent Results (from the past 240 hour(s))  Culture, blood (Routine X 2) w Reflex to ID Panel     Status: None   Collection Time: 01/11/17 12:45 AM  Result Value Ref Range Status   Specimen Description LEFT ANTECUBITAL  Final   Special Requests   Final    BOTTLES DRAWN AEROBIC AND ANAEROBIC 6CC DRAWN BY RN   Culture NO GROWTH 5 DAYS  Final   Report Status 01/17/2017 FINAL  Final   Culture, blood (Routine X 2) w Reflex to ID Panel     Status: None   Collection Time: 01/12/17 12:31 AM  Result Value Ref Range Status   Specimen Description WRIST  Final   Special Requests   Final    BOTTLES DRAWN AEROBIC AND ANAEROBIC 6CC DRAWN BY RN   Culture NO GROWTH 5 DAYS  Final   Report Status 01/17/2017 FINAL  Final  MRSA PCR Screening     Status: None   Collection Time: 01/12/17  3:05 AM  Result Value Ref Range Status   MRSA by PCR NEGATIVE NEGATIVE Final    Comment:        The GeneXpert MRSA Assay (FDA approved for NASAL specimens only), is one component of  a comprehensive MRSA colonization surveillance program. It is not intended to diagnose MRSA infection nor to guide or monitor treatment for MRSA infections.          Radiology Studies: No results found.      Scheduled Meds: . doxycycline  100 mg Oral Q12H   Continuous Infusions: . 0.45 % NaCl with KCl 20 mEq / L 75 mL/hr at 01/16/17 1739     LOS: 5 days    Time spent: 15    Rhetta MuraSAMTANI, JAI-GURMUKH, MD [facility] Pager [phone]  If 7PM-7AM, please contact night-coverage [email or web address] Password [email or web address] 01/17/2017, 2:44 PM

## 2017-01-18 DIAGNOSIS — E876 Hypokalemia: Secondary | ICD-10-CM

## 2017-01-18 DIAGNOSIS — E872 Acidosis: Secondary | ICD-10-CM

## 2017-01-18 LAB — BASIC METABOLIC PANEL
Anion gap: 7 (ref 5–15)
BUN: 11 mg/dL (ref 6–20)
CHLORIDE: 108 mmol/L (ref 101–111)
CO2: 23 mmol/L (ref 22–32)
Calcium: 8.5 mg/dL — ABNORMAL LOW (ref 8.9–10.3)
Creatinine, Ser: 0.78 mg/dL (ref 0.44–1.00)
GFR calc Af Amer: >60 mL/min (ref >60)
Glucose, Bld: 91 mg/dL (ref 65–99)
POTASSIUM: 3.6 mmol/L (ref 3.5–5.1)
SODIUM: 138 mmol/L (ref 135–145)

## 2017-01-18 MED ORDER — DOXYCYCLINE HYCLATE 100 MG PO TABS: 100.0000 mg | ORAL_TABLET | Freq: Two times a day (BID) | ORAL | 0 refills | Status: DC

## 2017-01-18 MED ORDER — OXYCODONE-ACETAMINOPHEN 5-325 MG PO TABS: 1.0000 | ORAL_TABLET | ORAL | 0 refills | Status: DC | PRN

## 2017-01-18 NOTE — Progress Notes (Signed)
Patient states understanding of discharge instructions,prescription

## 2017-01-18 NOTE — Discharge Summary (Signed)
Physician Discharge Summary  [name] ZOX:[id number] DOB: [birth year] DOA: 01/11/2017  PCP: [facility]  Admit date: 01/11/2017 Discharge date: 01/18/2017  Time spent: 20 minutes  Recommendations for Outpatient Follow-up:  1. Patient needs to complete 11 more days of doxycycline and 9 on 01/29/2017 2. Limited prescription now Percocet was given on discharge  Discharge Diagnoses:  Principal Problem:   Cellulitis Active Problems:   Postpartum state   Normocytic anemia   Metabolic acidosis   Hypokalemia   Cellulitis of right upper extremity   AKI (acute kidney injury) [facility])   Discharge Condition: Approved  Diet recommendation: Regular  Filed Weights   01/11/17 2110 01/12/17 0300  Weight: 72.6 kg (160 lb) 73 kg (160 lb 15 oz)    History of present illness:  31 year old female Postpartum 7 weeks admitted 1/27 with cellulitis right arm Treated with clindamycin previously 3 days prior to admission but failed outpatient Rx Orthopedics was consulted and CT was performed which should not identify abscess Hand seemed to improve slowly No fever no chills on day of discharge and recommended to complete course of medications Recommended to use cold compresses and complete course and limited prescription of pain meds given   Discharge Exam: Vitals:   01/17/17 2257 01/18/17 0632  BP: (!) 169/101 (!) 155/89  Pulse: 96 90  Resp: 20 20  Temp: 97.8 F (36.6 C) 98.5 F (36.9 C)    General: eomi ncat  Cardiovascular: s1 s2 no m/r/g Respiratory: clear no added sound  Discharge Instructions   Discharge Instructions    Diet - low sodium heart healthy    Complete by:  As directed    Discharge instructions    Complete by:  As directed    Follow up with primary MD 1 week for review of hand cellulitis finish ALL of the doxycyline Use Ibuprofen for 1st choice for pain and percocet for 2nd choice   Increase activity slowly    Complete by:  As directed       Current Discharge Medication List    START taking these medications   Details  doxycycline (VIBRA-TABS) 100 MG tablet Take 1 tablet (100 mg total) by mouth every 12 (twelve) hours. Qty: 22 tablet, Refills: 0    oxyCODONE-acetaminophen (PERCOCET/ROXICET) 5-325 MG tablet Take 1-2 tablets by mouth every 4 (four) hours as needed for severe pain. Qty: 30 tablet, Refills: 0      CONTINUE these medications which have NOT CHANGED   Details  ibuprofen (ADVIL,MOTRIN) 600 MG tablet Take 1 tablet (600 mg total) by mouth every 6 (six) hours as needed. Qty: 20 tablet, Refills: 0      STOP taking these medications     clindamycin (CLEOCIN) 300 MG capsule      HYDROcodone-acetaminophen (NORCO/VICODIN) 5-325 MG tablet      naproxen sodium (ALEVE) 220 MG tablet        No Known Allergies    The results of significant diagnostics from this hospitalization (including imaging, microbiology, ancillary and laboratory) are listed below for reference.    Significant Diagnostic Studies: US Venous Img Upper Uni Right  Result Date: 01/12/2017 CLINICAL DATA:  Right upper extremity edema, redness, discomfort 2 days EXAM: RIGHT UPPER EXTREMITY VENOUS DOPPLER ULTRASOUND TECHNIQUE: Gray-scale sonography with graded compression, as well as color Doppler and duplex ultrasound were performed to evaluate the upper extremity deep venous system from the level of the subclavian vein and including the jugular, axillary, basilic, radial, ulnar and upper cephalic vein.  Spectral Doppler was utilized to evaluate flow at rest and with distal augmentation maneuvers. COMPARISON:  None. FINDINGS: Contralateral Subclavian Vein: Respiratory phasicity is normal and symmetric with the symptomatic side. No evidence of thrombus. Normal compressibility. Internal Jugular Vein: No evidence of thrombus. Normal compressibility, respiratory phasicity and response to augmentation. Subclavian Vein: No evidence of thrombus. Normal  compressibility, respiratory phasicity and response to augmentation. Axillary Vein: No evidence of thrombus. Normal compressibility, respiratory phasicity and response to augmentation. Cephalic Vein: No evidence of thrombus. Normal compressibility, respiratory phasicity and response to augmentation. Basilic Vein: No evidence of thrombus. Normal compressibility, respiratory phasicity and response to augmentation. Brachial Veins: No evidence of thrombus. Normal compressibility, respiratory phasicity and response to augmentation. Radial Veins: No evidence of thrombus. Normal compressibility, respiratory phasicity and response to augmentation. Ulnar Veins: No evidence of thrombus. Normal compressibility, respiratory phasicity and response to augmentation. Venous Reflux:  None visualized. Other Findings:  Mild right arm subcutaneous edema. IMPRESSION: No evidence of deep venous thrombosis. Electronically Signed   By: Judie PetitM.  Shick M.D.   On: 01/12/2017 11:39   Ct Extrem Up Entire Arm R W/cm  Result Date: 01/14/2017 CLINICAL DATA:  Cellulitis, swelling of the right arm. EXAM: CT OF THE UPPER RIGHT EXTREMITY WITH CONTRAST TECHNIQUE: Multidetector CT imaging of the upper right extremity was performed according to the standard protocol following intravenous contrast administration. COMPARISON:  None. CONTRAST:  100mL ISOVUE-300 IOPAMIDOL (ISOVUE-300) INJECTION 61%<Contrast>13200mL ISOVUE-300 IOPAMIDOL (ISOVUE-300) INJECTION 61% FINDINGS: Bones/Joint/Cartilage No fracture or dislocation. No periosteal reaction or bone destruction. No lytic or sclerotic osseous lesion. No joint effusion. Ligaments Suboptimally assessed by CT. Muscles and Tendons Muscles are normal. No intramuscular fluid collection. No muscle atrophy. Soft tissues Soft tissue edema in the distal aspect of the right upper arm extending into the forearm and into the dorsal aspect of the hand most concerning for cellulitis. No drainable fluid collection.  IMPRESSION: 1. Soft tissue edema in the distal aspect of the right upper arm extending into the forearm and into the dorsal aspect of the hand most concerning for cellulitis. No drainable fluid collection to suggest an abscess. Electronically Signed   By: Elige KoHetal  Patel   On: 01/14/2017 19:38    Microbiology: Recent Results (from the past 240 hour(s))  Culture, blood (Routine X 2) w Reflex to ID Panel     Status: None   Collection Time: 01/11/17 12:45 AM  Result Value Ref Range Status   Specimen Description LEFT ANTECUBITAL  Final   Special Requests   Final    BOTTLES DRAWN AEROBIC AND ANAEROBIC 6CC DRAWN BY RN   Culture NO GROWTH 5 DAYS  Final   Report Status 01/17/2017 FINAL  Final  Culture, blood (Routine X 2) w Reflex to ID Panel     Status: None   Collection Time: 01/12/17 12:31 AM  Result Value Ref Range Status   Specimen Description WRIST  Final   Special Requests   Final    BOTTLES DRAWN AEROBIC AND ANAEROBIC 6CC DRAWN BY RN   Culture NO GROWTH 5 DAYS  Final   Report Status 01/17/2017 FINAL  Final  MRSA PCR Screening     Status: None   Collection Time: 01/12/17  3:05 AM  Result Value Ref Range Status   MRSA by PCR NEGATIVE NEGATIVE Final    Comment:        The GeneXpert MRSA Assay (FDA approved for NASAL specimens only), is one component of a comprehensive MRSA colonization surveillance program. It is  not intended to diagnose MRSA infection nor to guide or monitor treatment for MRSA infections.      Labs: Basic Metabolic Panel:  Recent Labs Lab 01/13/17 0701 01/14/17 6962 01/15/17 0624 01/16/17 0612 01/18/17 0526  NA 139 140 135 135 138  K 3.8 4.1 3.8 3.6 3.6  CL 114* 112* 107 104 108  CO2 17* 20* 21* 23 23  GLUCOSE 91 105* 90 93 91  BUN 24* 12 6 5* 11  CREATININE 0.68 0.67 0.48 0.49 0.78  CALCIUM 8.1* 8.0* 8.3* 8.3* 8.5*   Liver Function Tests: No results for input(s): AST, ALT, ALKPHOS, BILITOT, PROT, ALBUMIN in the last 168 hours. No results for  input(s): LIPASE, AMYLASE in the last 168 hours. No results for input(s): AMMONIA in the last 168 hours. CBC:  Recent Labs Lab 01/12/17 0031 01/13/17 0701 01/14/17 9528 01/15/17 0624 01/16/17 0612  WBC 11.6* 10.3 9.8 9.7 8.5  NEUTROABS 7.2  --   --   --   --   HGB 9.2* 7.4* 7.1* 7.5* 8.1*  HCT 26.7* 22.0* 21.2* 22.4* 25.2*  MCV 76.3* 76.4* 77.7* 77.5* 78.0  PLT 253 310 350 435* 485*   Cardiac Enzymes: No results for input(s): CKTOTAL, CKMB, CKMBINDEX, TROPONINI in the last 168 hours. BNP: BNP (last 3 results) No results for input(s): BNP in the last 8760 hours.  ProBNP (last 3 results) No results for input(s): PROBNP in the last 8760 hours.  CBG: No results for input(s): GLUCAP in the last 168 hours.     SignedRhetta Mura MD   [facility] 01/18/2017, 9:54 AM

## 2017-01-22 ENCOUNTER — Ambulatory Visit (INDEPENDENT_AMBULATORY_CARE_PROVIDER_SITE_OTHER): Payer: Medicaid Other | Admitting: Orthopedic Surgery

## 2017-01-22 VITALS — BP 83/51 | HR 51 | Wt 160.0 lb

## 2017-01-22 DIAGNOSIS — L02413 Cutaneous abscess of right upper limb: Secondary | ICD-10-CM | POA: Diagnosis not present

## 2017-01-22 NOTE — H&P (Addendum)
[name] is an 31 y.o. female.    History and physical for outpatient surgery   Chief Complaint: Pain swelling right arm  HPI: 31 year old female was admitted to the hospital for pain and swelling of her right arm thought to be secondary to IV infiltration. Recently postpartum. She was in the hospital for approximately one week on IV antibiotics did well however she was discharged on doxycycline developed a rash resents with pain swelling and an abscess over the right volar aspect of the forearm. Although the severe pain she had with the arm was swelling has resolved the pain is localized to the volar aspect of the right forearm. She's regained most of the motion in her elbow wrist and hand and does not complain of any carpal tunnel symptoms at this time  She has no prior medical history    Past Surgical History:  Procedure Laterality Date  . TONSILLECTOMY AND ADENOIDECTOMY      Family History  Problem Relation Age of Onset  . Cancer Maternal Grandmother   . Diabetes Mother    Social History:  reports that she has been smoking Cigarettes.  She has a 10.50 pack-year smoking history. She has never used smokeless tobacco. She reports that she does not drink alcohol or use drugs.  Allergies: No Known Allergies  No prescriptions prior to admission.    No results found for this or any previous visit (from the past 48 hour(s)). No results found.  Review of Systems  Constitutional: Negative for chills and fever.  All other systems reviewed and are negative.   Last menstrual period 04/30/2016. Physical Exam  Constitutional: She is oriented to person, place, and time. She appears well-developed and well-nourished. No distress.  HENT:  Head: Normocephalic and atraumatic.  Eyes: Pupils are equal, round, and reactive to light. Right eye exhibits no discharge. Left eye exhibits no discharge. No scleral icterus.  Neck: Normal range of motion. Neck supple. No JVD present. No  tracheal deviation present. No thyromegaly present.  Cardiovascular: Normal rate and intact distal pulses.   Respiratory: Effort normal. No stridor.  GI: Soft. She exhibits no distension.  Musculoskeletal:  Gait normal   At the distal aspect of the right forearm on the volar side there is an indurated areas with surrounding erythema and fluid/abscess  Radioulnar median nerve sensation is intact she has normal passive flexion extension of the wrist and normal flexion extension of the digits of the right hand she has no grip strength loss. The area is tender and indurated. The remaining portion of the compartments of the forearm are soft. Epitrochlear lymph nodes are normal  Lymphadenopathy:    She has no cervical adenopathy.  Neurological: She is alert and oriented to person, place, and time. She has normal reflexes. She exhibits normal muscle tone. Coordination normal.  Skin: Skin is warm and dry. No rash noted. She is not diaphoretic. No erythema. No pallor.  Psychiatric: She has a normal mood and affect. Her behavior is normal. Thought content normal.     Assessment/Plan Abscess right forearm  This requires incision and drainage of the right forearm  Risks and benefits and complications explained including alternative treatments. The patient has opted for surgical treatment   Fuller CanadaStanley Tezra Mahr, MD 01/22/2017, 5:27 PM

## 2017-01-22 NOTE — Patient Instructions (Signed)
SURGERY 01/23/17 INCISION AND DRAINAGE RIGHT FOREARM

## 2017-01-22 NOTE — Progress Notes (Signed)
FOLLOW UP VISIT   Patient ID: [name], female   DOB: 26-Feb-1986, 31 y.o.   MRN: [id number]  Chief Complaint  Patient presents with  . Arm Problem    RIGHT FOREARM CELLULITIS, ABCESS     HPI Lisa SchoolingShannon M Grzesiak is a 31 y.o. female.   HPI  31 year old female developed pain and swelling of the right forearm thought to be secondary to IV infiltration was treated in the hospital with IV antibiotics and sent home on  doxycycline   She possessed a for follow-up  She's developed a localized abscess on the volar aspect of the forearm despite significant improvement in the overall swelling of the forearm  She will need incision and drainage Review of Systems Review of Systems  Constitutional: Negative for chills and fever.  Skin: Positive for rash.  All other systems reviewed and are negative.   Physical Exam  Constitutional: She is oriented to person, place, and time. She appears well-developed and well-nourished. No distress.  Cardiovascular: Normal rate and intact distal pulses.   Musculoskeletal:  Right forearm at the distal aspect on the volar side there is a large abscess with erythema surrounding induration with soft forearm compartments. She has regained full range of motion of the wrist and hand with no carpal tunnel symptoms. No strength deficits. There is a rash on both arms and this was noted after she started taking doxycycline. She has a good pulse is no lymphadenitis or lymphangitis or lymph node enlargement and sensation is normal.  Neurological: She is alert and oriented to person, place, and time. She has normal reflexes. She exhibits normal muscle tone. Coordination normal.  Skin: Skin is warm and dry. No rash noted. She is not diaphoretic. No erythema. No pallor.  Psychiatric: She has a normal mood and affect. Her behavior is normal. Judgment and thought content normal.    MEDICAL DECISION MAKING  DATA  DIAGNOSIS  Abscess right forearm  PLAN(RISK)     Incision and drainage right forearm

## 2017-01-23 ENCOUNTER — Encounter (HOSPITAL_COMMUNITY): Payer: Self-pay | Admitting: *Deleted

## 2017-01-23 ENCOUNTER — Ambulatory Visit (HOSPITAL_COMMUNITY): Payer: Medicaid Other | Admitting: Anesthesiology

## 2017-01-23 ENCOUNTER — Encounter (HOSPITAL_COMMUNITY)
Admission: RE | Disposition: A | Discharge status: Home / Self Care | Payer: Self-pay | Source: Ambulatory Visit | Attending: Orthopedic Surgery

## 2017-01-23 ENCOUNTER — Ambulatory Visit (HOSPITAL_COMMUNITY)
Admission: RE | Admit: 2017-01-23 | Discharge: 2017-01-23 | Disposition: A | Discharge status: Home / Self Care | Payer: Medicaid Other | Source: Ambulatory Visit | Attending: Orthopedic Surgery | Admitting: Orthopedic Surgery

## 2017-01-23 DIAGNOSIS — L089 Local infection of the skin and subcutaneous tissue, unspecified: Secondary | ICD-10-CM | POA: Diagnosis present

## 2017-01-23 DIAGNOSIS — L02413 Cutaneous abscess of right upper limb: Secondary | ICD-10-CM

## 2017-01-23 DIAGNOSIS — F17210 Nicotine dependence, cigarettes, uncomplicated: Secondary | ICD-10-CM | POA: Diagnosis not present

## 2017-01-23 HISTORY — PX: INCISION AND DRAINAGE: SHX5863

## 2017-01-23 LAB — PREGNANCY, URINE: PREG TEST UR: NEGATIVE

## 2017-01-23 SURGERY — INCISION AND DRAINAGE
Anesthesia: General | Laterality: Right

## 2017-01-23 MED ORDER — PROPOFOL 10 MG/ML IV BOLUS
INTRAVENOUS | Status: AC
  Filled 2017-01-23: qty 20

## 2017-01-23 MED ORDER — FENTANYL CITRATE (PF) 100 MCG/2ML IJ SOLN
INTRAMUSCULAR | Status: DC | PRN
  Administered 2017-01-23 (×4): 50 ug via INTRAVENOUS

## 2017-01-23 MED ORDER — DIPHENHYDRAMINE HCL 50 MG/ML IJ SOLN
INTRAMUSCULAR | Status: AC
Start: 2017-01-23 — End: ?
  Filled 2017-01-23: qty 1

## 2017-01-23 MED ORDER — BUPIVACAINE HCL 0.5 % IJ SOLN
INTRAMUSCULAR | Status: DC | PRN
  Administered 2017-01-23: 10 mL

## 2017-01-23 MED ORDER — BUPIVACAINE HCL (PF) 0.5 % IJ SOLN
INTRAMUSCULAR | Status: AC
  Filled 2017-01-23: qty 30

## 2017-01-23 MED ORDER — FENTANYL CITRATE (PF) 100 MCG/2ML IJ SOLN
INTRAMUSCULAR | Status: AC
  Filled 2017-01-23: qty 2

## 2017-01-23 MED ORDER — CHLORHEXIDINE GLUCONATE 4 % EX LIQD: 60.0000 mL | Freq: Once | CUTANEOUS | Status: DC

## 2017-01-23 MED ORDER — DEXAMETHASONE SODIUM PHOSPHATE 4 MG/ML IJ SOLN
INTRAMUSCULAR | Status: AC
  Filled 2017-01-23: qty 1

## 2017-01-23 MED ORDER — SODIUM CHLORIDE 0.9 % IR SOLN
Status: DC | PRN
  Administered 2017-01-23: 1000 mL

## 2017-01-23 MED ORDER — LABETALOL HCL 5 MG/ML IV SOLN
10.0000 mg | Freq: Once | INTRAVENOUS | Status: AC
  Administered 2017-01-23: 10 mg via INTRAVENOUS

## 2017-01-23 MED ORDER — LACTATED RINGERS IV SOLN
INTRAVENOUS | Status: DC
  Administered 2017-01-23: 11:00:00 via INTRAVENOUS

## 2017-01-23 MED ORDER — LIDOCAINE HCL (PF) 1 % IJ SOLN
INTRAMUSCULAR | Status: AC
  Filled 2017-01-23: qty 5

## 2017-01-23 MED ORDER — LIDOCAINE HCL 1 % IJ SOLN
INTRAMUSCULAR | Status: DC | PRN
  Administered 2017-01-23: 25 mg via INTRADERMAL

## 2017-01-23 MED ORDER — MIDAZOLAM HCL 2 MG/2ML IJ SOLN
INTRAMUSCULAR | Status: AC
  Filled 2017-01-23: qty 2

## 2017-01-23 MED ORDER — PROPOFOL 10 MG/ML IV BOLUS
INTRAVENOUS | Status: DC | PRN
  Administered 2017-01-23: 200 mg via INTRAVENOUS

## 2017-01-23 MED ORDER — DEXAMETHASONE SODIUM PHOSPHATE 4 MG/ML IJ SOLN
4.0000 mg | Freq: Once | INTRAMUSCULAR | Status: AC
  Administered 2017-01-23: 4 mg via INTRAVENOUS

## 2017-01-23 MED ORDER — MIDAZOLAM HCL 2 MG/2ML IJ SOLN
1.0000 mg | INTRAMUSCULAR | Status: AC
  Administered 2017-01-23: 2 mg via INTRAVENOUS

## 2017-01-23 MED ORDER — LABETALOL HCL 5 MG/ML IV SOLN
INTRAVENOUS | Status: AC
  Filled 2017-01-23: qty 4

## 2017-01-23 MED ORDER — MIDAZOLAM HCL 5 MG/5ML IJ SOLN
INTRAMUSCULAR | Status: DC | PRN
  Administered 2017-01-23: 2 mg via INTRAVENOUS

## 2017-01-23 MED ORDER — OXYCODONE-ACETAMINOPHEN 5-325 MG PO TABS: 1.0000 | ORAL_TABLET | ORAL | 0 refills | Status: DC | PRN

## 2017-01-23 MED ORDER — SULFAMETHOXAZOLE-TRIMETHOPRIM 800-160 MG PO TABS: 1.0000 | ORAL_TABLET | Freq: Two times a day (BID) | ORAL | 1 refills | Status: DC

## 2017-01-23 MED ORDER — FENTANYL CITRATE (PF) 100 MCG/2ML IJ SOLN
25.0000 ug | INTRAMUSCULAR | Status: DC | PRN
  Administered 2017-01-23: 50 ug via INTRAVENOUS

## 2017-01-23 MED ORDER — DIPHENHYDRAMINE HCL 50 MG/ML IJ SOLN
25.0000 mg | Freq: Once | INTRAMUSCULAR | Status: AC
  Administered 2017-01-23: 25 mg via INTRAVENOUS

## 2017-01-23 MED ORDER — FENTANYL CITRATE (PF) 100 MCG/2ML IJ SOLN
25.0000 ug | INTRAMUSCULAR | Status: AC
  Administered 2017-01-23: 25 ug via INTRAVENOUS
  Filled 2017-01-23: qty 2

## 2017-01-23 MED ORDER — CEFAZOLIN SODIUM-DEXTROSE 2-4 GM/100ML-% IV SOLN
2.0000 g | INTRAVENOUS | Status: AC
  Administered 2017-01-23: 2 g via INTRAVENOUS
  Filled 2017-01-23: qty 100

## 2017-01-23 SURGICAL SUPPLY — 37 items
BAG HAMPER (MISCELLANEOUS) ×3 IMPLANT
BANDAGE ELASTIC 3 VELCRO ST LF (GAUZE/BANDAGES/DRESSINGS) ×2 IMPLANT
BANDAGE ELASTIC 6 VELCRO ST LF (GAUZE/BANDAGES/DRESSINGS) IMPLANT
BANDAGE ESMARK 4X12 BL STRL LF (DISPOSABLE) ×1 IMPLANT
BNDG CMPR 12X4 ELC STRL LF (DISPOSABLE) ×1
BNDG CONFORM 2 STRL LF (GAUZE/BANDAGES/DRESSINGS) IMPLANT
BNDG ESMARK 4X12 BLUE STRL LF (DISPOSABLE) ×3
BNDG GAUZE ROLL STR 2.25X3YD (GAUZE/BANDAGES/DRESSINGS) ×2 IMPLANT
BNDG GZE SM 3X2.25 6 PLY (GAUZE/BANDAGES/DRESSINGS) ×1
CLOTH BEACON ORANGE TIMEOUT ST (SAFETY) ×3 IMPLANT
COVER LIGHT HANDLE STERIS (MISCELLANEOUS) ×6 IMPLANT
CUFF TOURNIQUET SINGLE 18IN (TOURNIQUET CUFF) ×3 IMPLANT
DRSG ALLEVYN 2X2 (GAUZE/BANDAGES/DRESSINGS) IMPLANT
DRSG PAD ABDOMINAL 8X10 ST (GAUZE/BANDAGES/DRESSINGS) ×2 IMPLANT
ELECT REM PT RETURN 9FT ADLT (ELECTROSURGICAL) ×3
ELECTRODE REM PT RTRN 9FT ADLT (ELECTROSURGICAL) ×1 IMPLANT
GAUZE IODOFORM PACK 1/2 7832 (GAUZE/BANDAGES/DRESSINGS) ×2 IMPLANT
GAUZE SPONGE 4X4 12PLY STRL (GAUZE/BANDAGES/DRESSINGS) IMPLANT
GLOVE BIOGEL PI IND STRL 7.0 (GLOVE) ×1 IMPLANT
GLOVE BIOGEL PI INDICATOR 7.0 (GLOVE) ×2
GLOVE SKINSENSE NS SZ8.0 LF (GLOVE) ×2
GLOVE SKINSENSE STRL SZ8.0 LF (GLOVE) ×1 IMPLANT
GLOVE SS N UNI LF 8.5 STRL (GLOVE) ×3 IMPLANT
GOWN STRL REUS W/TWL LRG LVL3 (GOWN DISPOSABLE) ×9 IMPLANT
GOWN STRL REUS W/TWL XL LVL3 (GOWN DISPOSABLE) ×3 IMPLANT
HAND ALUMI LG (SOFTGOODS) ×3 IMPLANT
KIT ROOM TURNOVER APOR (KITS) ×3 IMPLANT
MANIFOLD NEPTUNE II (INSTRUMENTS) ×3 IMPLANT
NS IRRIG 1000ML POUR BTL (IV SOLUTION) ×3 IMPLANT
PACK BASIC LIMB (CUSTOM PROCEDURE TRAY) IMPLANT
PAD ABD 5X9 TENDERSORB (GAUZE/BANDAGES/DRESSINGS) IMPLANT
PAD ARMBOARD 7.5X6 YLW CONV (MISCELLANEOUS) ×3 IMPLANT
PADDING CAST COTTON 6X4 STRL (CAST SUPPLIES) IMPLANT
SET BASIN LINEN APH (SET/KITS/TRAYS/PACK) ×3 IMPLANT
SUT ETHILON 3 0 FSL (SUTURE) IMPLANT
SYR BULB IRRIGATION 50ML (SYRINGE) ×3 IMPLANT
VESSEL LOOPS MAXI RED (MISCELLANEOUS) IMPLANT

## 2017-01-23 NOTE — Interval H&P Note (Signed)
History and Physical Interval Note:  01/23/2017 11:26 AM  [name]  has presented today for surgery, with the diagnosis of INFECTION RIGHT FOREARM  The various methods of treatment have been discussed with the patient and family. After consideration of risks, benefits and other options for treatment, the patient has consented to  Procedure(s) with comments: INCISION AND DRAINAGE OF RIGHT FOREARM (Right) - pt to arrive at 10:15, pt was INPT 1/26 as a surgical intervention .  The patient's history has been reviewed, patient examined, no change in status, stable for surgery.  I have reviewed the patient's chart and labs.  Questions were answered to the patient's satisfaction.     Fuller CanadaStanley Teylor Wolven

## 2017-01-23 NOTE — Op Note (Signed)
01/23/2017  12:17 PM  PATIENT:  [name]  31 y.o. female  PRE-OPERATIVE DIAGNOSIS:  INFECTION RIGHT FOREARM  POST-OPERATIVE DIAGNOSIS:  * abscess right forearm  *  PROCEDURE:  Procedure(s) with comments: INCISION AND DRAINAGE OF RIGHT FOREARM (Right) - pt to arrive at 10:15, pt was INPT 1/26   Findings subcutaneous abscess volar aspect right forearm  Cultures were obtained microbiology anaerobic and aerobic  Procedure was done as follows  Patient identified in the preop area after proper identification site marking was completed chart review was completed  Patient was taken to the operating room and given 2 g of Ancef  She was prepped and draped sterilely after general anesthesia.  Timeout was completed  A straight incision was made in over the abscess subtenons tissue was divided and a small abscess with decubitus exudate was drained. Finger dissection was used to bluntly dissect through the soft tissues there is no other purulence in the deep space  Thorough irrigation was performed followed by packing with Quarter-inch iodoform gauze. We apply and insert, Surgicel to help control oozing.   4 x 4's and abdominal pad were placed along with Kerlix and Ace wrap  Patient extubated and taken recovery room in stable condition   Surgeon:  Surgeon(s) and Role:    * Janis Sol E Omarius Grantham, MD - Primary  PHYSICIAN ASSISTANT:   ASSISTANTS: betty ashley   ANESTHESIA:   general  EBL:  Total I/O In: 400 [I.V.:400] Out: 10 [Blood:10]  BLOOD ADMINISTERED:none  DRAINS: none Thorough irrigation was performed followed by     LOCAL MEDICATIONS USED:  MARCAINE     SPECIMEN:  Source of Specimen:  right forearm abscess microbio specimens  DISPOSITION OF SPECIMEN:  micro  COUNTS:  YES  TOURNIQUET:  * No tourniquets in log *  DICTATION: .Dragon Dictation  PLAN OF CARE: Discharge to home after PACU  PATIENT DISPOSITION:  PACU - hemodynamically stable.   Delay start of  Pharmacological VTE agent (>24hrs) due to surgical blood loss or risk of bleeding: not applicable  10060  

## 2017-01-23 NOTE — Brief Op Note (Addendum)
01/23/2017  12:17 PM  PATIENT:  [name]  31 y.o. female  PRE-OPERATIVE DIAGNOSIS:  INFECTION RIGHT FOREARM  POST-OPERATIVE DIAGNOSIS:  * abscess right forearm  *  PROCEDURE:  Procedure(s) with comments: INCISION AND DRAINAGE OF RIGHT FOREARM (Right) - pt to arrive at 10:15, pt was INPT 1/26   Findings subcutaneous abscess volar aspect right forearm  Cultures were obtained microbiology anaerobic and aerobic  Procedure was done as follows  Patient identified in the preop area after proper identification site marking was completed chart review was completed  Patient was taken to the operating room and given 2 g of Ancef  She was prepped and draped sterilely after general anesthesia.  Timeout was completed  A straight incision was made in over the abscess subtenons tissue was divided and a small abscess with decubitus exudate was drained. Finger dissection was used to bluntly dissect through the soft tissues there is no other purulence in the deep space  Thorough irrigation was performed followed by packing with Quarter-inch iodoform gauze. We apply and insert, Surgicel to help control oozing.   4 x 4's and abdominal pad were placed along with Kerlix and Ace wrap  Patient extubated and taken recovery room in stable condition   Surgeon:  Surgeon(s) and Role:    * Vickki HearingStanley E Laylani Pudwill, MD - Primary  PHYSICIAN ASSISTANT:   ASSISTANTS: betty ashley   ANESTHESIA:   general  EBL:  Total I/O In: 400 [I.V.:400] Out: 10 [Blood:10]  BLOOD ADMINISTERED:none  DRAINS: none Thorough irrigation was performed followed by     LOCAL MEDICATIONS USED:  MARCAINE     SPECIMEN:  Source of Specimen:  right forearm abscess microbio specimens  DISPOSITION OF SPECIMEN:  micro  COUNTS:  YES  TOURNIQUET:  * No tourniquets in log *  DICTATION: .Dragon Dictation  PLAN OF CARE: Discharge to home after PACU  PATIENT DISPOSITION:  PACU - hemodynamically stable.   Delay start of  Pharmacological VTE agent (>24hrs) due to surgical blood loss or risk of bleeding: not applicable  [id number]

## 2017-01-23 NOTE — Anesthesia Procedure Notes (Signed)
Procedure Name: LMA Insertion Date/Time: 01/23/2017 11:43 AM Performed by: Despina HiddenIDACAVAGE, Dodie Parisi J Pre-anesthesia Checklist: Patient identified, Patient being monitored, Emergency Drugs available, Timeout performed and Suction available Patient Re-evaluated:Patient Re-evaluated prior to inductionOxygen Delivery Method: Circle System Utilized Preoxygenation: Pre-oxygenation with 100% oxygen Intubation Type: IV induction Ventilation: Mask ventilation without difficulty LMA: LMA inserted LMA Size: 4.0 Number of attempts: 1 Placement Confirmation: positive ETCO2 and breath sounds checked- equal and bilateral Tube secured with: Tape Dental Injury: Teeth and Oropharynx as per pre-operative assessment

## 2017-01-23 NOTE — Anesthesia Preprocedure Evaluation (Addendum)
Anesthesia Evaluation  Patient identified by MRN, date of birth, ID band Patient awake    Reviewed: Allergy & Precautions, NPO status , Patient's Chart, lab work & pertinent test results  Airway Mallampati: II  TM Distance: >3 FB     Dental  (+) Poor Dentition, Chipped, Dental Advisory Given,    Pulmonary Current Smoker,    breath sounds clear to auscultation       Cardiovascular negative cardio ROS   Rhythm:Regular Rate:Normal     Neuro/Psych    GI/Hepatic negative GI ROS,   Endo/Other    Renal/GU      Musculoskeletal   Abdominal   Peds  Hematology   Anesthesia Other Findings   Reproductive/Obstetrics                            Anesthesia Physical Anesthesia Plan  ASA: I  Anesthesia Plan: General   Post-op Pain Management:    Induction: Intravenous  Airway Management Planned: LMA  Additional Equipment:   Intra-op Plan:   Post-operative Plan: Extubation in OR  Informed Consent: I have reviewed the patients History and Physical, chart, labs and discussed the procedure including the risks, benefits and alternatives for the proposed anesthesia with the patient or authorized representative who has indicated his/her understanding and acceptance.     Plan Discussed with:   Anesthesia Plan Comments:         Anesthesia Quick Evaluation

## 2017-01-23 NOTE — Anesthesia Postprocedure Evaluation (Signed)
Anesthesia Post Note  Patient: [name]  Procedure(s) Performed: Procedure(s) (LRB): INCISION AND DRAINAGE OF RIGHT FOREARM (Right)  Patient location during evaluation: Short Stay Anesthesia Type: General Level of consciousness: awake and alert Pain management: satisfactory to patient Vital Signs Assessment: post-procedure vital signs reviewed and stable Respiratory status: spontaneous breathing Cardiovascular status: stable Anesthetic complications: no     Last Vitals:  Vitals:   01/23/17 1310 01/23/17 1318  BP: (!) 144/84 (!) 141/84  Pulse:  77  Resp: 16 16  Temp:  36.7 C    Last Pain:  Vitals:   01/23/17 1320  TempSrc:   PainSc: 7                  Mavis Gravelle

## 2017-01-23 NOTE — Transfer of Care (Signed)
Immediate Anesthesia Transfer of Care Note  Patient: Lisa SchoolingShannon M Bedgood  Procedure(s) Performed: Procedure(s) with comments: INCISION AND DRAINAGE OF RIGHT FOREARM (Right) - pt to arrive at 10:15, pt was INPT 1/26  Patient Location: PACU  Anesthesia Type:General  Level of Consciousness: awake and patient cooperative  Airway & Oxygen Therapy: Patient Spontanous Breathing and Patient connected to face mask oxygen  Post-op Assessment: Report given to RN, Post -op Vital signs reviewed and stable and Patient moving all extremities  Post vital signs: Reviewed and stable  Last Vitals:  Vitals:   01/23/17 1125 01/23/17 1130  BP: (!) 158/108 (!) 150/104  Pulse: 60 62  Resp: 16 16    Last Pain: There were no vitals filed for this visit.       Complications: No apparent anesthesia complications

## 2017-01-24 ENCOUNTER — Encounter (HOSPITAL_COMMUNITY): Payer: Self-pay | Admitting: Orthopedic Surgery

## 2017-01-28 LAB — AEROBIC/ANAEROBIC CULTURE (SURGICAL/DEEP WOUND): CULTURE: NO GROWTH

## 2017-01-28 LAB — AEROBIC/ANAEROBIC CULTURE W GRAM STAIN (SURGICAL/DEEP WOUND)

## 2017-02-01 ENCOUNTER — Ambulatory Visit (INDEPENDENT_AMBULATORY_CARE_PROVIDER_SITE_OTHER): Payer: Medicaid Other | Admitting: Orthopedic Surgery

## 2017-02-01 DIAGNOSIS — L02413 Cutaneous abscess of right upper limb: Secondary | ICD-10-CM

## 2017-02-01 DIAGNOSIS — Z4889 Encounter for other specified surgical aftercare: Secondary | ICD-10-CM

## 2017-02-01 MED ORDER — IBUPROFEN 600 MG PO TABS: 600.0000 mg | ORAL_TABLET | Freq: Four times a day (QID) | ORAL | 0 refills | Status: DC | PRN

## 2017-02-01 NOTE — Progress Notes (Signed)
Patient ID: [name], female   DOB: 1986-07-15, 31 y.o.   MRN: [id number]  Follow up visit/  postop visit status post incision and drainage right forearm  Chief Complaint  Patient presents with  . Follow-up    post op recheck on right forearm, DOS 01-23-17.    Volar wound is approximately 3 cm long 2 cm wide and 1/2 cm deep  He does have fibrinous is exudate and serous drainage  Cultures are negative  Recommend daily dressing changes wet to dry  Return for wound check  Encounter Diagnoses  Name Primary?  Marland Kitchen. Abscess of right forearm   . Aftercare following surgery Yes    LMP 04/30/2016 (Exact Date)   12:06 PM Fuller CanadaStanley Harrison, MD 02/01/2017

## 2017-02-06 ENCOUNTER — Telehealth: Payer: Self-pay | Admitting: Orthopedic Surgery

## 2017-02-06 ENCOUNTER — Other Ambulatory Visit: Payer: Self-pay | Admitting: *Deleted

## 2017-02-06 MED ORDER — IBUPROFEN 600 MG PO TABS: 600.0000 mg | ORAL_TABLET | Freq: Four times a day (QID) | ORAL | 0 refills | Status: DC | PRN

## 2017-02-06 NOTE — Telephone Encounter (Signed)
Patient's mother called and said that [name] was having increased pain.  She wanted to know if she should come in this afternoon to be seen.  Please call [name] on home number first and then if unable to reach, call her cell phone   Thanks

## 2017-02-06 NOTE — Telephone Encounter (Signed)
REFILLED

## 2017-02-06 NOTE — Telephone Encounter (Signed)
Patient requests refill on Ibuprofen (Advil, Motrin) 600 mgs.  Qty  30        Sig: Take 1 tablet (600 mg total) by mouth every 6 (six) hours as needed.

## 2017-02-08 ENCOUNTER — Ambulatory Visit: Payer: Medicaid Other | Admitting: Orthopedic Surgery

## 2017-02-11 ENCOUNTER — Encounter: Payer: Self-pay | Admitting: Orthopedic Surgery

## 2017-02-11 ENCOUNTER — Ambulatory Visit (INDEPENDENT_AMBULATORY_CARE_PROVIDER_SITE_OTHER): Payer: Medicaid Other | Admitting: Orthopedic Surgery

## 2017-02-11 DIAGNOSIS — L02413 Cutaneous abscess of right upper limb: Secondary | ICD-10-CM

## 2017-02-11 DIAGNOSIS — Z4889 Encounter for other specified surgical aftercare: Secondary | ICD-10-CM

## 2017-02-11 MED ORDER — SULFAMETHOXAZOLE-TRIMETHOPRIM 800-160 MG PO TABS: 1.0000 | ORAL_TABLET | Freq: Two times a day (BID) | ORAL | 1 refills | Status: DC

## 2017-02-11 MED ORDER — OXYCODONE-ACETAMINOPHEN 5-325 MG PO TABS: 1.0000 | ORAL_TABLET | ORAL | 0 refills | Status: AC | PRN

## 2017-02-11 MED ORDER — IBUPROFEN 600 MG PO TABS
600.0000 mg | ORAL_TABLET | Freq: Four times a day (QID) | ORAL | 0 refills | Status: DC | PRN
Start: 2017-02-11 — End: 2018-03-31

## 2017-02-11 NOTE — Progress Notes (Signed)
Postop visit status post incision drainage right forearm abscess which was culture-negative patient is on oxycodone, Bactrim, ibuprofen  Wound looks good it's about a centimeter and 0.5 x 0.5 deep by centimeter wide  Wet to dry dressings are being performed  She complained of some forearm and hand swelling. It looks like it was secondary to the waves been wrapped as she has been having to do it herself.  The wound looks pink at its bed the fibula and this tissue has dissolved at this point  Continue dressing changes follow-up 4 weeks refill all medicines

## 2017-02-11 NOTE — Patient Instructions (Signed)
Wet to dry dressing daily

## 2017-03-11 ENCOUNTER — Ambulatory Visit: Payer: Medicaid Other | Admitting: Orthopedic Surgery

## 2017-03-11 ENCOUNTER — Encounter: Payer: Self-pay | Admitting: Orthopedic Surgery

## 2017-07-11 IMAGING — US US EXTREM  UP VENOUS*R*
1 series · 13 of 24 positions shown · non-contrast
Comparison: None.

CLINICAL DATA: Right upper extremity edema, redness, discomfort 2
days



[Series 1: us extrem up venous*right* · 0.08mm/px · 13 of 45 slices shown]
[im 1/45]
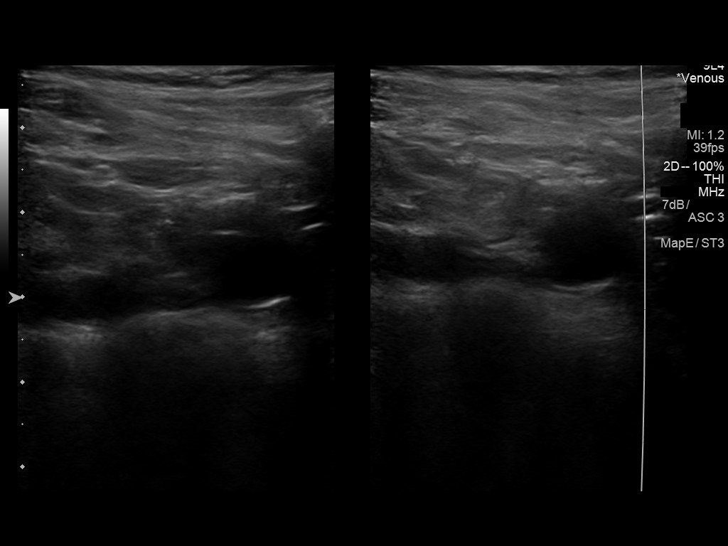
[im 4/45]
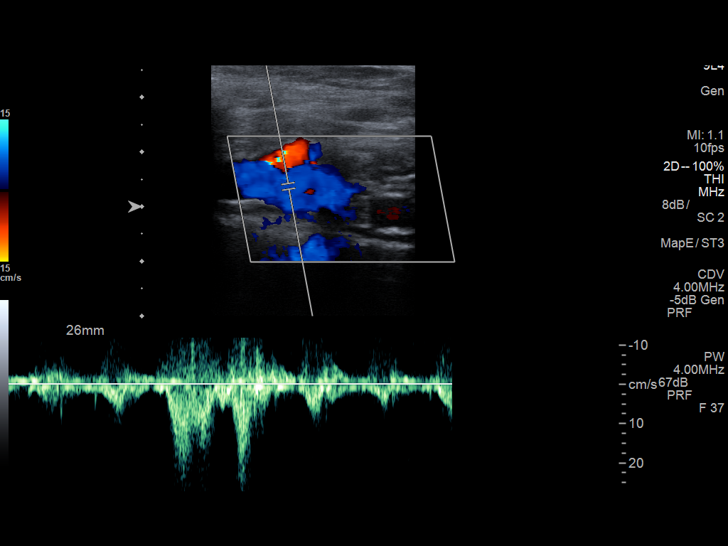
[im 8/45]
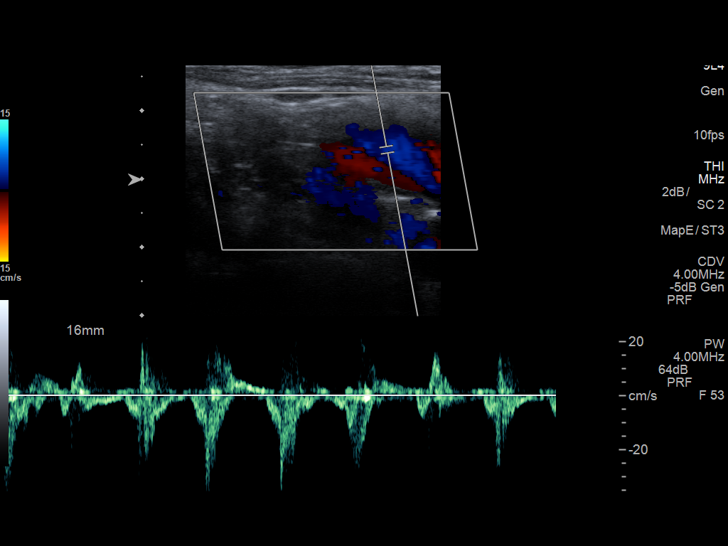
[im 12/45]
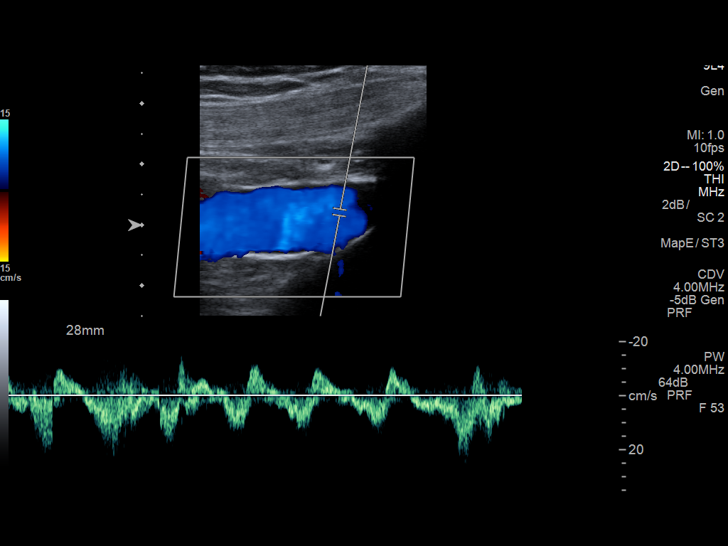
[im 16/45]
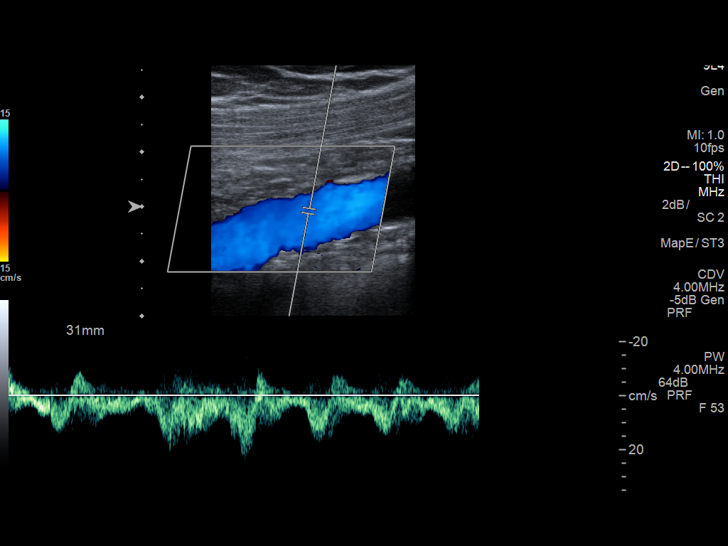
[im 20/45]
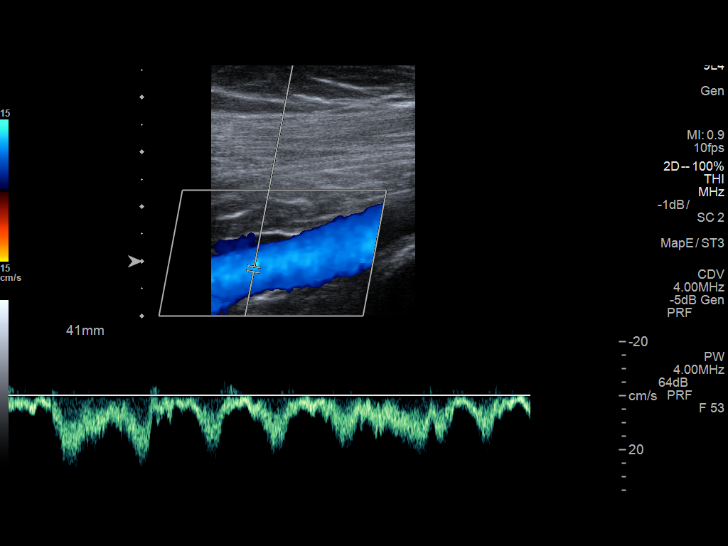
[im 23/45]
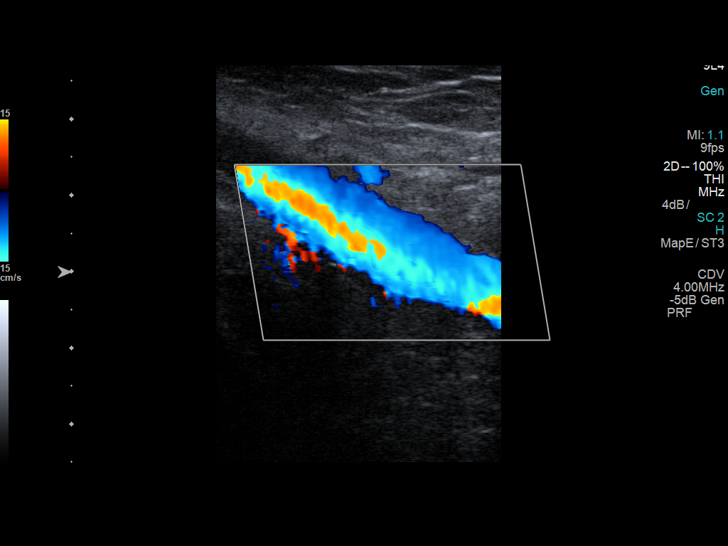
[im 25/45]
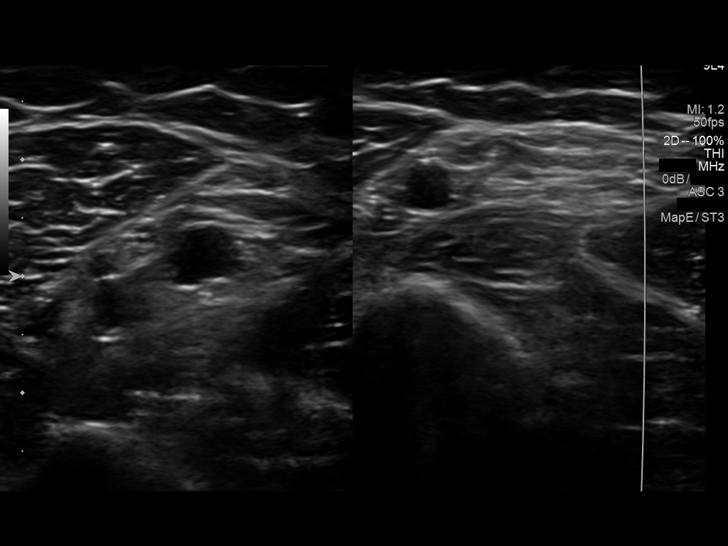
[im 29/45]
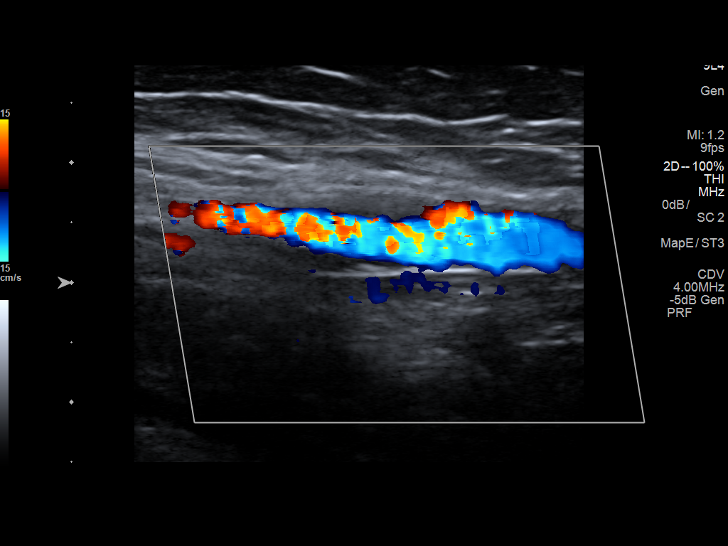
[im 33/45]
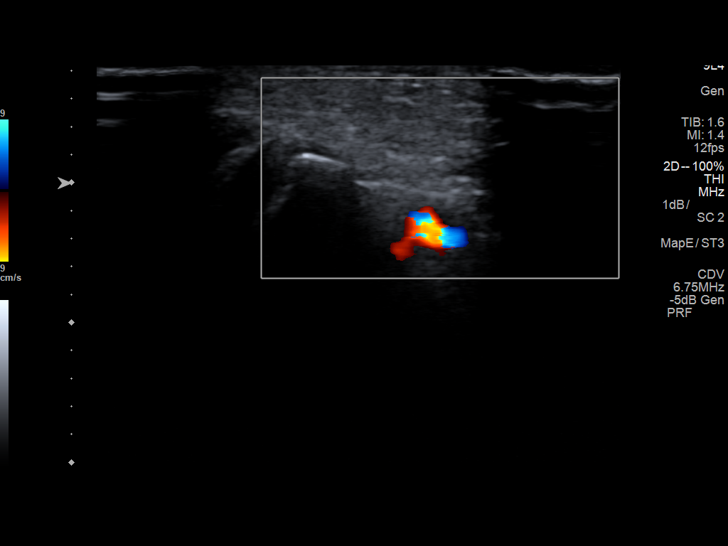
[im 37/45]
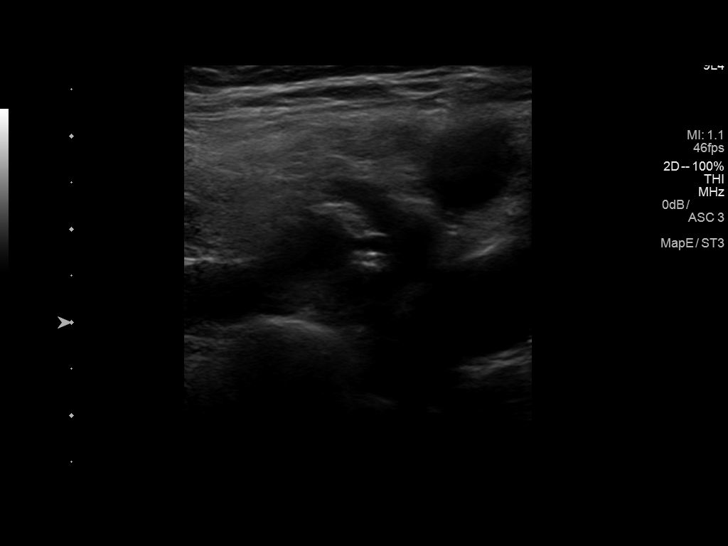
[im 41/45]
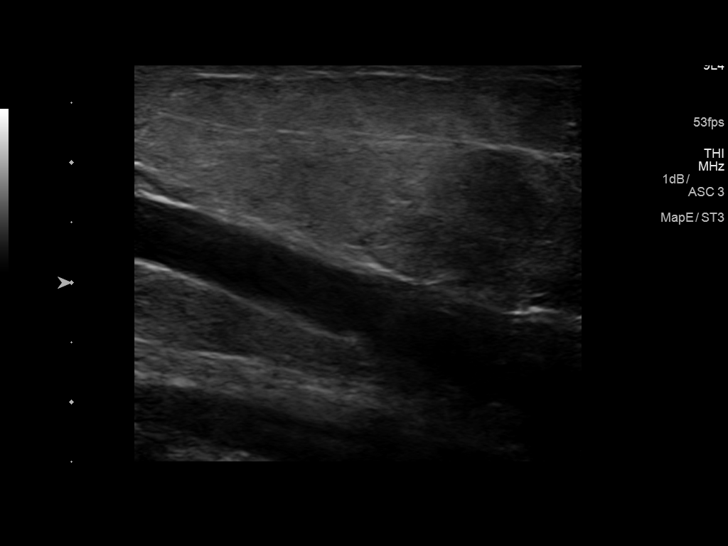
[im 45/45]
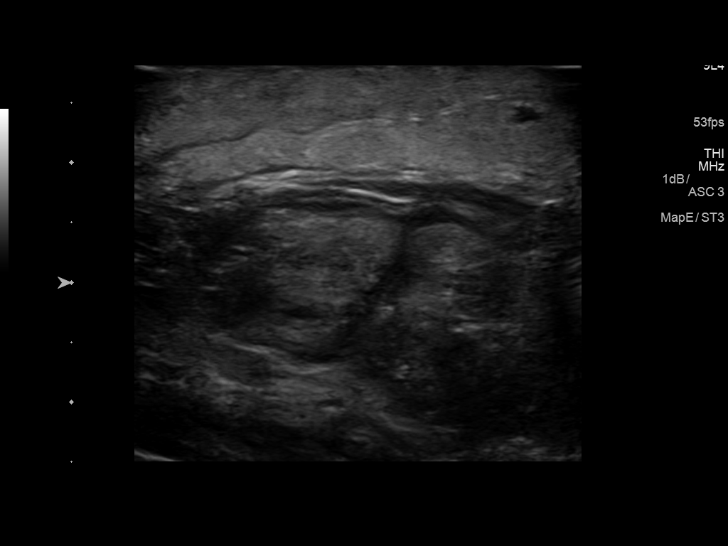

[13 of 24 positions shown; findings below may reference images not displayed]

FINDINGS: Contralateral Subclavian Vein: Respiratory phasicity is normal and
symmetric with the symptomatic side. No evidence of thrombus. Normal
compressibility.

Internal Jugular Vein: No evidence of thrombus. Normal
compressibility, respiratory phasicity and response to augmentation.

Subclavian Vein: No evidence of thrombus. Normal compressibility,
respiratory phasicity and response to augmentation.

Axillary Vein: No evidence of thrombus. Normal compressibility,
respiratory phasicity and response to augmentation.

Cephalic Vein: No evidence of thrombus. Normal compressibility,
respiratory phasicity and response to augmentation.

Basilic Vein: No evidence of thrombus. Normal compressibility,
respiratory phasicity and response to augmentation.

Brachial Veins: No evidence of thrombus. Normal compressibility,
respiratory phasicity and response to augmentation.

Radial Veins: No evidence of thrombus. Normal compressibility,
respiratory phasicity and response to augmentation.

Ulnar Veins: No evidence of thrombus. Normal compressibility,
respiratory phasicity and response to augmentation.

Venous Reflux:  None visualized.

Other Findings:  Mild right arm subcutaneous edema.
IMPRESSION: No evidence of deep venous thrombosis.

## 2018-03-30 ENCOUNTER — Other Ambulatory Visit: Payer: Self-pay

## 2018-03-30 ENCOUNTER — Emergency Department
Admission: EM | Admit: 2018-03-30 | Discharge: 2018-03-31 | Disposition: A | Discharge status: Home / Self Care | Payer: Medicaid Other | Attending: Emergency Medicine | Admitting: Emergency Medicine

## 2018-03-30 ENCOUNTER — Encounter: Payer: Self-pay | Admitting: *Deleted

## 2018-03-30 DIAGNOSIS — M25572 Pain in left ankle and joints of left foot: Secondary | ICD-10-CM | POA: Insufficient documentation

## 2018-03-30 DIAGNOSIS — Z5321 Procedure and treatment not carried out due to patient leaving prior to being seen by health care provider: Secondary | ICD-10-CM | POA: Insufficient documentation

## 2018-03-30 NOTE — ED Triage Notes (Signed)
Pt to triage  Via wheelchair.  Pt slipped and fell inside the house.  Pt has left ankle pain.  Swelling to ankle

## 2018-03-31 ENCOUNTER — Emergency Department (HOSPITAL_COMMUNITY): Payer: Medicaid Other

## 2018-03-31 ENCOUNTER — Emergency Department: Payer: Medicaid Other

## 2018-03-31 ENCOUNTER — Encounter (HOSPITAL_COMMUNITY): Payer: Self-pay | Admitting: Emergency Medicine

## 2018-03-31 ENCOUNTER — Other Ambulatory Visit: Payer: Self-pay

## 2018-03-31 ENCOUNTER — Emergency Department (HOSPITAL_COMMUNITY)
Admission: EM | Admit: 2018-03-31 | Discharge: 2018-03-31 | Disposition: A | Discharge status: Home / Self Care | Payer: Medicaid Other | Attending: Emergency Medicine | Admitting: Emergency Medicine

## 2018-03-31 DIAGNOSIS — Y929 Unspecified place or not applicable: Secondary | ICD-10-CM | POA: Diagnosis not present

## 2018-03-31 DIAGNOSIS — S93402A Sprain of unspecified ligament of left ankle, initial encounter: Secondary | ICD-10-CM | POA: Diagnosis not present

## 2018-03-31 DIAGNOSIS — Y999 Unspecified external cause status: Secondary | ICD-10-CM | POA: Insufficient documentation

## 2018-03-31 DIAGNOSIS — S99912A Unspecified injury of left ankle, initial encounter: Secondary | ICD-10-CM | POA: Diagnosis present

## 2018-03-31 DIAGNOSIS — W010XXA Fall on same level from slipping, tripping and stumbling without subsequent striking against object, initial encounter: Secondary | ICD-10-CM | POA: Diagnosis not present

## 2018-03-31 DIAGNOSIS — Y939 Activity, unspecified: Secondary | ICD-10-CM | POA: Diagnosis not present

## 2018-03-31 DIAGNOSIS — F17210 Nicotine dependence, cigarettes, uncomplicated: Secondary | ICD-10-CM | POA: Diagnosis not present

## 2018-03-31 MED ORDER — IBUPROFEN 600 MG PO TABS: 600.0000 mg | ORAL_TABLET | Freq: Four times a day (QID) | ORAL | 0 refills | Status: AC | PRN

## 2018-03-31 MED ORDER — TRAMADOL HCL 50 MG PO TABS: 50.0000 mg | ORAL_TABLET | Freq: Four times a day (QID) | ORAL | 0 refills | Status: AC | PRN
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
## 2018-03-31 NOTE — Discharge Instructions (Signed)
Wear the ASO and use crutches to avoid weight bearing.  Use ice and elevation as much as possible for the next several days to help reduce the swelling.  Take the medications prescribed.  You may take the tramadol prescribed for pain relief.  This will make you drowsy - do not drive within 4 hours of taking this medication.  Use the ibuprofen also for inflammation.  Call the orthopedic doctor listed for a recheck of your injury in 2 weeks if it is not improving as discussed.  You may benefit from physical therapy of your ankle if it is not getting better with todays treatment plan.

## 2018-03-31 NOTE — ED Triage Notes (Signed)
Pt states slipped and fell twisting ankle last night.

## 2018-03-31 NOTE — ED Provider Notes (Addendum)
[facility] EMERGENCY DEPARTMENT Provider Note   CSN: [id number] Arrival date & time: 03/31/18  1602     History   Chief Complaint Chief Complaint  Patient presents with  . Ankle Pain    HPI [name] is a 32 y.o. female presenting with left ankle pain which occurred suddenly when the patient slipped in icing (dropped a birthday cake) landing with her left foot and ankle tucked underneath her.  Pain is aching, constant and worse with palpation, movement and weight bearing.  The patient was unable to weight bear immediately after the event.  There is no radiation of pain and the patient denies numbness distal to the injury site.  The patients treatment prior to arrival included ibuprofen, tylenol, ice and elevation. .  The history is provided by the patient and the spouse.    History reviewed. No pertinent past medical history.  Patient Active Problem List   Diagnosis Date Noted  . Abscess of forearm, right   . AKI (acute kidney injury) (HCC) 01/13/2017  . Cellulitis 01/12/2017  . Postpartum state 01/12/2017  . Normocytic anemia 01/12/2017  . Metabolic acidosis 01/12/2017  . Hypokalemia 01/12/2017  . Cellulitis of right upper extremity     Past Surgical History:  Procedure Laterality Date  . INCISION AND DRAINAGE Right 01/23/2017   Procedure: INCISION AND DRAINAGE OF RIGHT FOREARM;  Surgeon: Vickki HearingStanley E Harrison, MD;  Location: [facility];  Service: Orthopedics;  Laterality: Right;  pt to arrive at 10:15, pt was INPT 1/26  . TONSILLECTOMY AND ADENOIDECTOMY       OB History    Gravida  6   Para  3   Term  2   Preterm  1   AB  2   Living  3     SAB  2   TAB      Ectopic      Multiple      Live Births  2            Home Medications    Prior to Admission medications   Medication Sig Start Date End Date Taking? Authorizing Provider  ibuprofen (ADVIL,MOTRIN) 600 MG tablet Take 1 tablet (600 mg total) by mouth every 6 (six) hours as needed. 03/31/18    Lisa AmorIdol, Tabbatha Bordelon, PA-C  oxyCODONE-acetaminophen (PERCOCET/ROXICET) 5-325 MG tablet Take 1 tablet by mouth every 4 (four) hours as needed for severe pain. 02/11/17   Vickki HearingHarrison, Stanley E, MD  sulfamethoxazole-trimethoprim (BACTRIM DS,SEPTRA DS) 800-160 MG tablet Take 1 tablet by mouth 2 (two) times daily. 02/11/17   Vickki HearingHarrison, Stanley E, MD  traMADol (ULTRAM) 50 MG tablet Take 1 tablet (50 mg total) by mouth every 6 (six) hours as needed. 03/31/18   Lisa AmorIdol, Alesha Jaffee, PA-C    Family History Family History  Problem Relation Age of Onset  . Cancer Maternal Grandmother   . Diabetes Mother     Social History Social History   Tobacco Use  . Smoking status: Current Every Day Smoker    Packs/day: 0.50    Years: 21.00    Pack years: 10.50    Types: Cigarettes  . Smokeless tobacco: Never Used  Substance Use Topics  . Alcohol use: No    Comment: not now  . Drug use: No     Allergies   Doxycycline   Review of Systems Review of Systems  Musculoskeletal: Positive for arthralgias and joint swelling.  Skin: Negative for wound.  Neurological: Negative for weakness and numbness.  Physical Exam Updated Vital Signs BP 139/85 (BP Location: Right Arm)   Pulse 91   Temp 98.3 F (36.8 C) (Oral)   Resp 16   Ht 5' (1.524 m)   Wt 74.8 kg (165 lb)   LMP 03/07/2018 (Approximate)   SpO2 97%   BMI 32.22 kg/m   Physical Exam  Constitutional: She appears well-developed and well-nourished.  HENT:  Head: Normocephalic.  Cardiovascular: Normal rate and intact distal pulses. Exam reveals no decreased pulses.  Pulses:      Dorsalis pedis pulses are 2+ on the right side, and 2+ on the left side.       Posterior tibial pulses are 2+ on the right side, and 2+ on the left side.  Musculoskeletal: She exhibits edema and tenderness.       Right ankle: She exhibits normal pulse. No head of 5th metatarsal and no proximal fibula tenderness found.       Left ankle: She exhibits swelling and ecchymosis. She  exhibits no deformity and normal pulse. Tenderness. Lateral malleolus tenderness found. No head of 5th metatarsal and no proximal fibula tenderness found. Achilles tendon normal.  Neurological: She is alert. No sensory deficit.  Skin: Skin is warm, dry and intact.  Nursing note and vitals reviewed.    ED Treatments / Results  Labs (all labs ordered are listed, but only abnormal results are displayed) Labs Reviewed - No data to display  EKG None  Radiology Dg Ankle Complete Left  Result Date: 03/31/2018 CLINICAL DATA:  Fall last night.  Ankle injury EXAM: LEFT ANKLE COMPLETE - 3+ VIEW COMPARISON:  None. FINDINGS: There is no evidence of fracture, dislocation, or joint effusion. There is no evidence of arthropathy or other focal bone abnormality. Soft tissues are unremarkable. IMPRESSION: Negative. Electronically Signed   By: Marlan Palau M.D.   On: 03/31/2018 16:46    Procedures Procedures (including critical care time)  Medications Ordered in ED Medications - No data to display   Initial Impression / Assessment and Plan / ED Course  I have reviewed the triage vital signs and the nursing notes.  Pertinent labs & imaging results that were available during my care of the patient were reviewed by me and considered in my medical decision making (see chart for details).     Ankle sprain left with normal imaging.  xrays discussed with pt.  Ankle sprain with significant pain, edema and bruising.  RICE, air splint, crutches, tramadol, continue ibuprofen.  referral to Dr. Romeo Apple prn if not improving over the next 2 weeks (has seen Dr. Romeo Apple previously).  [place] controlled substance database reviewed.   Final Clinical Impressions(s) / ED Diagnoses   Final diagnoses:  Sprain of left ankle, unspecified ligament, initial encounter    ED Discharge Orders        Ordered    traMADol (ULTRAM) 50 MG tablet  Every 6 hours PRN     03/31/18 1809    ibuprofen (ADVIL,MOTRIN) 600 MG  tablet  Every 6 hours PRN     03/31/18 1810       Lisa Amor, PA-C 03/31/18 1811    Lisa Amor, PA-C 03/31/18 1813    Raeford Razor, MD 04/02/18 [phone]

## 2018-03-31 NOTE — ED Notes (Signed)
Pt seen by [employer] officer getting into vehicle and driving away

## 2018-10-18 ENCOUNTER — Emergency Department (HOSPITAL_COMMUNITY)
Admission: EM | Admit: 2018-10-18 | Discharge: 2018-10-19 | Disposition: A | Discharge status: Home / Self Care | Payer: Medicaid Other | Attending: Emergency Medicine | Admitting: Emergency Medicine

## 2018-10-18 ENCOUNTER — Encounter (HOSPITAL_COMMUNITY): Payer: Self-pay

## 2018-10-18 DIAGNOSIS — L0291 Cutaneous abscess, unspecified: Secondary | ICD-10-CM

## 2018-10-18 DIAGNOSIS — L02212 Cutaneous abscess of back [any part, except buttock]: Secondary | ICD-10-CM | POA: Insufficient documentation

## 2018-10-18 DIAGNOSIS — F17210 Nicotine dependence, cigarettes, uncomplicated: Secondary | ICD-10-CM | POA: Insufficient documentation

## 2018-10-18 DIAGNOSIS — R222 Localized swelling, mass and lump, trunk: Secondary | ICD-10-CM | POA: Diagnosis present

## 2018-10-18 DIAGNOSIS — I10 Essential (primary) hypertension: Secondary | ICD-10-CM | POA: Insufficient documentation

## 2018-10-18 MED ORDER — LIDOCAINE HCL (PF) 2 % IJ SOLN
5.0000 mL | Freq: Once | INTRAMUSCULAR | Status: AC
  Administered 2018-10-18: 5 mL via INTRADERMAL

## 2018-10-18 MED ORDER — LIDOCAINE HCL (PF) 2 % IJ SOLN
INTRAMUSCULAR | Status: AC
  Filled 2018-10-18: qty 20

## 2018-10-18 MED ORDER — HYDROCODONE-ACETAMINOPHEN 5-325 MG PO TABS
1.0000 | ORAL_TABLET | Freq: Once | ORAL | Status: AC
  Administered 2018-10-18: 1 via ORAL
  Filled 2018-10-18: qty 1

## 2018-10-18 NOTE — ED Provider Notes (Signed)
[facility] EMERGENCY DEPARTMENT Provider Note   CSN: [id number] Arrival date & time: 10/18/18  2149     History   Chief Complaint Chief Complaint  Patient presents with  . Abscess    HPI [name] is a 32 y.o. female.  HPI   [name] is a 32 y.o. female who presents to the Emergency Department complaining of focal pain and redness to her right upper back.  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  2 days ago, she notes an area of swelling to her back that someone told her was an abscess and squeezed on it, since that time she reports increased pain and redness to the area.  No significant drainage.  She reports history of same.  No recent fever or chills, no neck pain or pain to the right upper extremity    History reviewed. No pertinent past medical history.  Patient Active Problem List   Diagnosis Date Noted  . Abscess of forearm, right   . AKI (acute kidney injury) (HCC) 01/13/2017  . Cellulitis 01/12/2017  . Postpartum state 01/12/2017  . Normocytic anemia 01/12/2017  . Metabolic acidosis 01/12/2017  . Hypokalemia 01/12/2017  . Cellulitis of right upper extremity     Past Surgical History:  Procedure Laterality Date  . INCISION AND DRAINAGE Right 01/23/2017   Procedure: INCISION AND DRAINAGE OF RIGHT FOREARM;  Surgeon: Vickki Hearing, MD;  Location: [facility];  Service: Orthopedics;  Laterality: Right;  pt to arrive at 10:15, pt was INPT 1/26  . TONSILLECTOMY AND ADENOIDECTOMY       OB History    Gravida  6   Para  3   Term  2   Preterm  1   AB  2   Living  3     SAB  2   TAB      Ectopic      Multiple      Live Births  2            Home Medications    Prior to Admission medications   Medication Sig Start Date End Date Taking? Authorizing Provider  ibuprofen (ADVIL,MOTRIN) 600 MG tablet Take 1 tablet (600 mg total) by mouth every 6 (six) hours as needed. 03/31/18   Burgess Amor, PA-C    oxyCODONE-acetaminophen (PERCOCET/ROXICET) 5-325 MG tablet Take 1 tablet by mouth every 4 (four) hours as needed for severe pain. 02/11/17   Vickki Hearing, MD  sulfamethoxazole-trimethoprim (BACTRIM DS,SEPTRA DS) 800-160 MG tablet Take 1 tablet by mouth 2 (two) times daily. 02/11/17   Vickki Hearing, MD  traMADol (ULTRAM) 50 MG tablet Take 1 tablet (50 mg total) by mouth every 6 (six) hours as needed. 03/31/18   Burgess Amor, PA-C    Family History Family History  Problem Relation Age of Onset  . Cancer Maternal Grandmother   . Diabetes Mother     Social History Social History   Tobacco Use  . Smoking status: Current Every Day Smoker    Packs/day: 0.50    Years: 21.00    Pack years: 10.50    Types: Cigarettes  . Smokeless tobacco: Never Used  Substance Use Topics  . Alcohol use: No    Comment: not now  . Drug use: No     Allergies   Doxycycline   Review of Systems Review of Systems  Constitutional: Negative for chills and fever.  Gastrointestinal: Negative for nausea and vomiting.  Musculoskeletal: Negative for arthralgias and joint swelling.  Skin: Positive for color change.       Abscess   Hematological: Negative for adenopathy.  All other systems reviewed and are negative.    Physical Exam Updated Vital Signs BP (!) 165/111 (BP Location: Right Arm)   Pulse (!) 126   Temp 99.3 F (37.4 C) (Oral)   Resp 16   Ht 5' (1.524 m)   Wt 79.4 kg   SpO2 98%   BMI 34.18 kg/m   Physical Exam  Constitutional: She appears well-developed and well-nourished. No distress.  HENT:  Head: Normocephalic and atraumatic.  Cardiovascular: Normal rate, regular rhythm and normal heart sounds.  No murmur heard. Pulmonary/Chest: Effort normal and breath sounds normal. No respiratory distress.  Neurological: She is alert. No sensory deficit.  Skin: Skin is warm and dry. Capillary refill takes less than 2 seconds. There is erythema.  Focal area of mild erythema and  induration of the right upper back.  No fluctuance or drainage.  Multiple acne scars of the back.    Nursing note and vitals reviewed.    ED Treatments / Results  Labs (all labs ordered are listed, but only abnormal results are displayed) Labs Reviewed - No data to display  EKG None  Radiology No results found.  Procedures Procedures (including critical care time)   INCISION AND DRAINAGE Performed by: Betzy Barbier Consent: Verbal consent obtained. Risks and benefits: risks, benefits and alternatives were discussed Type: abscess  Body area: right upper back  Anesthesia: local infiltration  Incision was made with a #11 scalpel.  Local anesthetic: lidocaine 2 % w/o epinephrine  Anesthetic total: 3 ml  Complexity: complex Blunt dissection to break up loculations  Drainage: purulent  Drainage amount: moderate  Packing material: 1/4 in iodoform gauze  Patient tolerance: Patient tolerated the procedure well with no immediate complications.    Medications Ordered in ED Medications  HYDROcodone-acetaminophen (NORCO/VICODIN) 5-325 MG per tablet 1 tablet (has no administration in time range)  lidocaine (XYLOCAINE) 2 % injection 5 mL (has no administration in time range)  lidocaine (XYLOCAINE) 2 % injection (has no administration in time range)     Initial Impression / Assessment and Plan / ED Course  I have reviewed the triage vital signs and the nursing notes.  Pertinent labs & imaging results that were available during my care of the patient were reviewed by me and considered in my medical decision making (see chart for details).     Pt with abscess to upper back.  Successful I&D. Agrees to tx plan with warm compresses, abx and packing removal in 2 days.  Return precautions discussed.   Pt is hypertensive.  No urgency, pt is not symptomatic.  Reports increased stressors at home recently.  She agrees to close f/u to have this rechecked.  Referral for triad  medicine provided.    Final Clinical Impressions(s) / ED Diagnoses   Final diagnoses:  Abscess  Hypertension, unspecified type    ED Discharge Orders    None       Pauline Aus, PA-C 10/19/18 0052    Glynn Octave, MD 10/19/18 0225

## 2018-10-18 NOTE — ED Triage Notes (Signed)
Pt arrives from home via POV c/o of abscess on posterior right back. Similar abscess located proximal to the one causing discomfort. Pt states someone tried popping it and draining it but caused it to get worse. The abscess presents warm and red, with purulent exudate.

## 2018-10-19 MED ORDER — SULFAMETHOXAZOLE-TRIMETHOPRIM 800-160 MG PO TABS: 1.0000 | ORAL_TABLET | Freq: Two times a day (BID) | ORAL | 0 refills | Status: AC

## 2018-10-19 MED ORDER — HYDROCODONE-ACETAMINOPHEN 5-325 MG PO TABS: ORAL_TABLET | ORAL | 0 refills | Status: AC

## 2018-10-19 NOTE — Discharge Instructions (Addendum)
Warm water soaks or compresses to your back 2-3 times a day.  The packing will need to be removed in 2 days.  Keep the area bandaged until it begins to heal.  Your blood pressure this evening is elevated, this may be related to pain or stress but you will need to have your blood pressure rechecked.  I have provided the information for the local clinic for you to establish primary care and have your blood pressure rechecked

## 2018-10-22 MED FILL — Hydrocodone-Acetaminophen Tab 5-325 MG: ORAL | Qty: 6 | Status: AC

## 2019-06-15 DIAGNOSIS — H5213 Myopia, bilateral: Secondary | ICD-10-CM | POA: Diagnosis not present

## 2019-06-17 DIAGNOSIS — H5213 Myopia, bilateral: Secondary | ICD-10-CM | POA: Diagnosis not present

## 2019-08-17 IMAGING — DX DG ANKLE COMPLETE 3+V*L*
3 series · 3 of 3 positions shown · non-contrast
Comparison: None.

CLINICAL DATA: Fall last night.  Ankle injury

EXAM:
LEFT ANKLE COMPLETE - 3+ VIEW

[ankle ap]
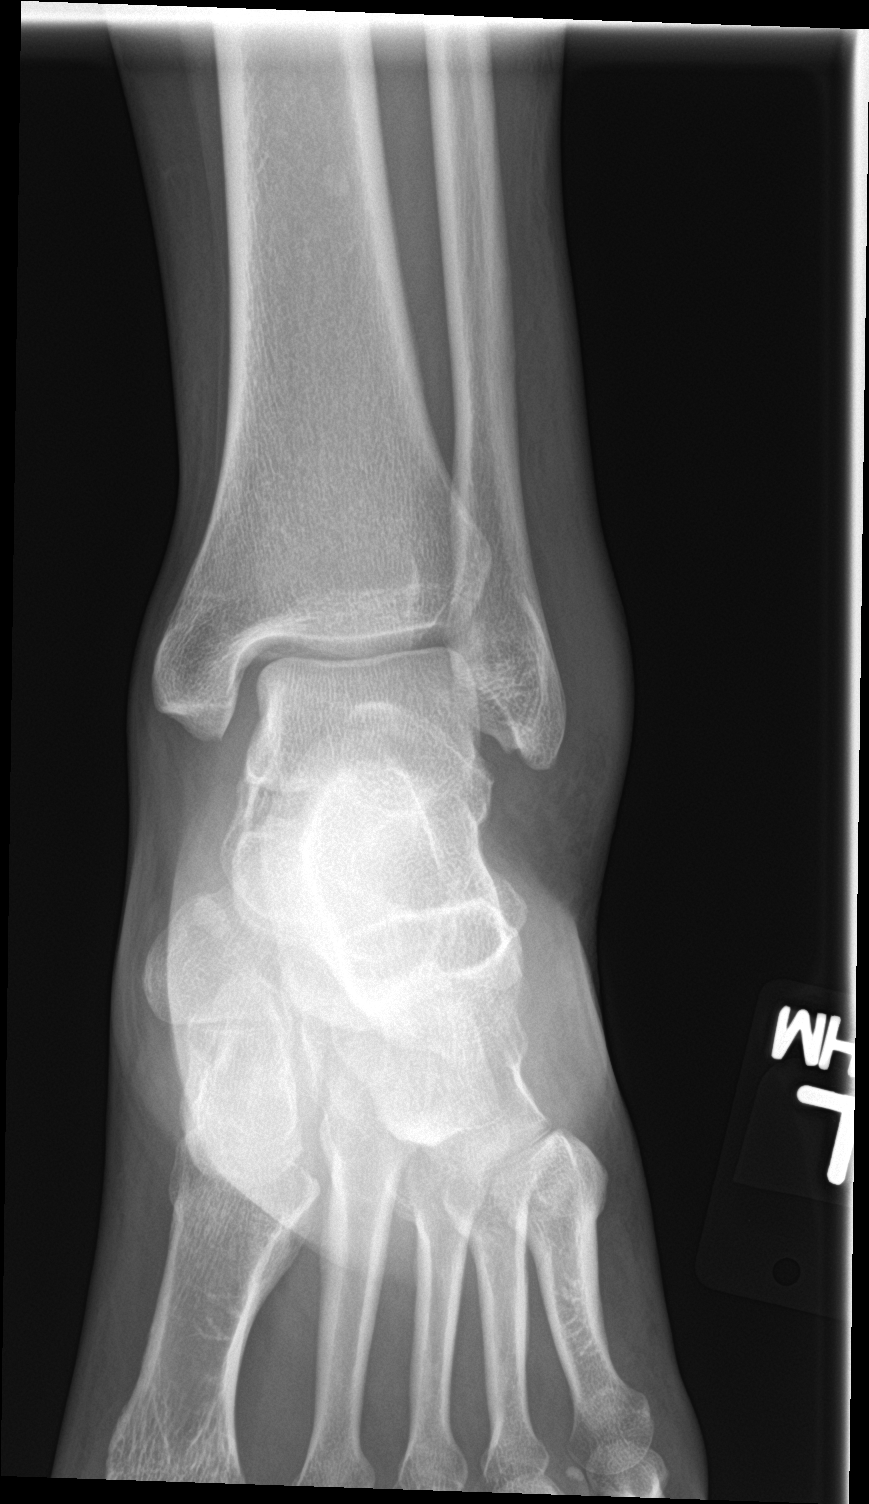

[ankle obl]
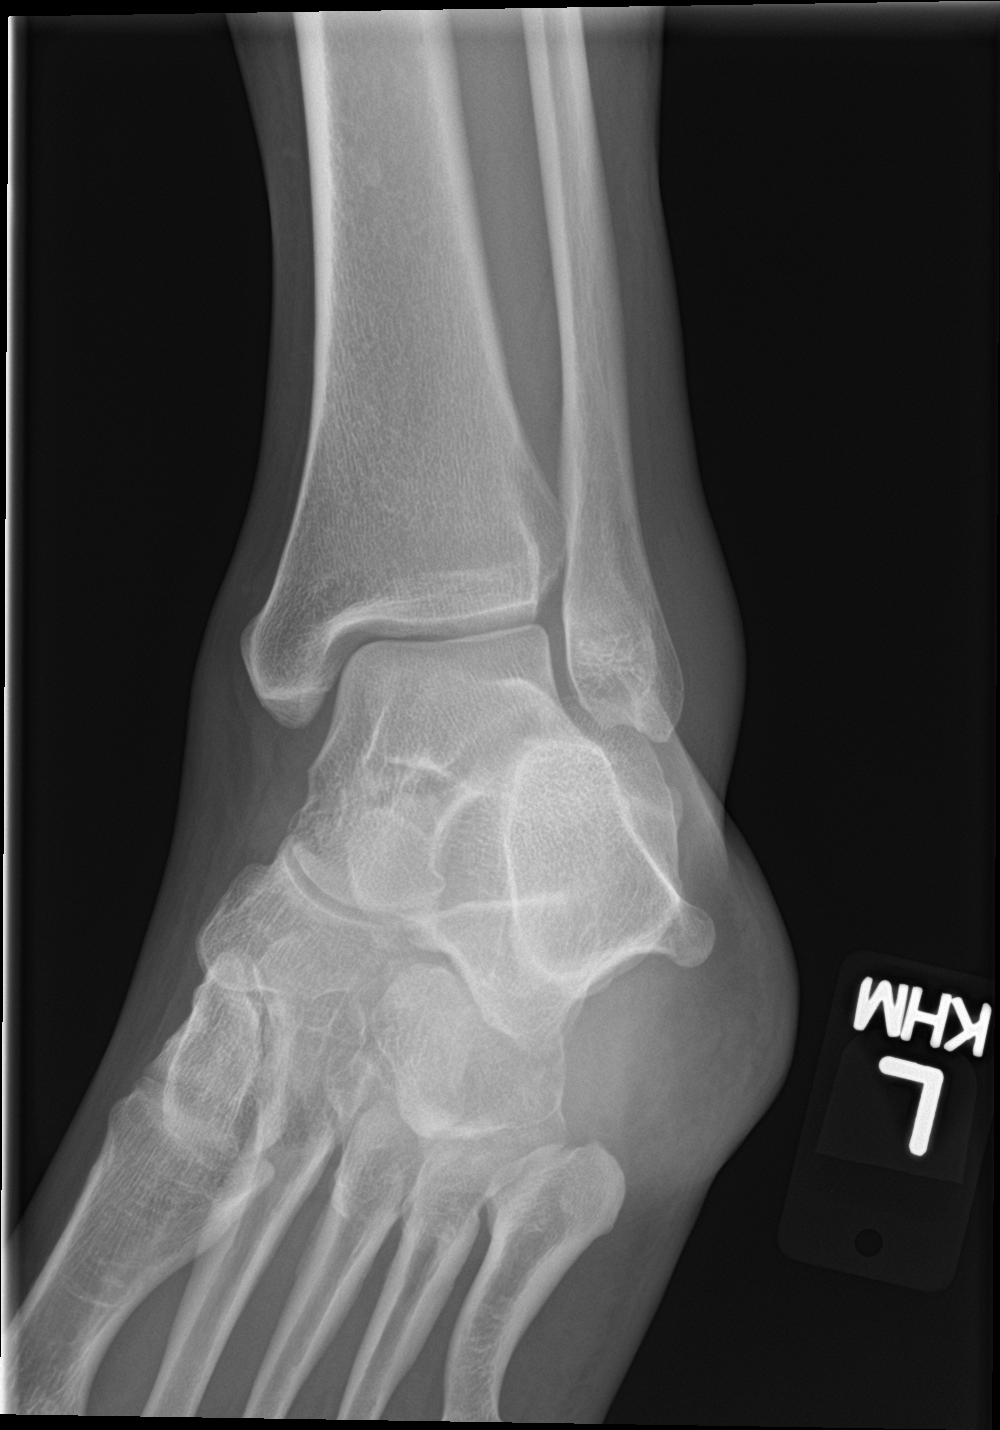

[ankle lat]
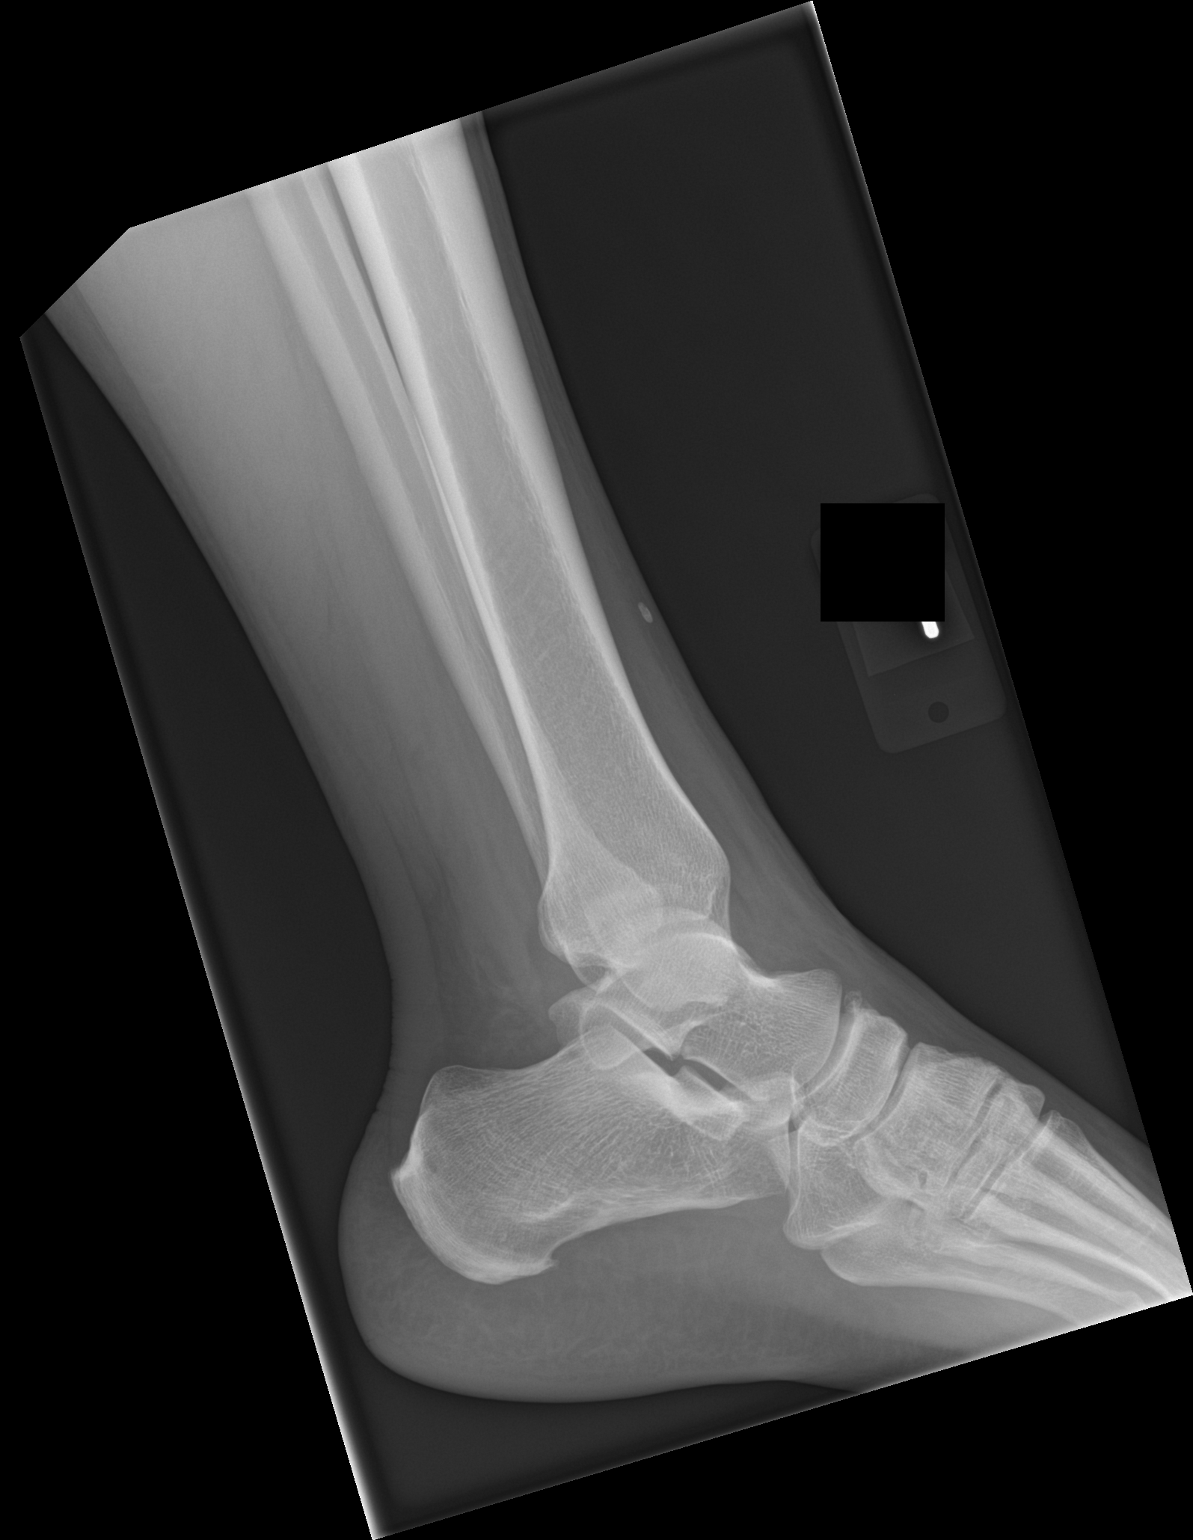

[3 of 3 positions shown; findings below may reference images not displayed]

FINDINGS: There is no evidence of fracture, dislocation, or joint effusion.
There is no evidence of arthropathy or other focal bone abnormality.
Soft tissues are unremarkable.
IMPRESSION: Negative.

## 2020-02-12 DIAGNOSIS — F329 Major depressive disorder, single episode, unspecified: Secondary | ICD-10-CM | POA: Diagnosis not present

## 2020-02-22 DIAGNOSIS — F329 Major depressive disorder, single episode, unspecified: Secondary | ICD-10-CM | POA: Diagnosis not present

## 2020-02-23 DIAGNOSIS — F329 Major depressive disorder, single episode, unspecified: Secondary | ICD-10-CM | POA: Diagnosis not present

## 2020-05-03 ENCOUNTER — Ambulatory Visit: Payer: Medicaid Other | Attending: Internal Medicine

## 2020-05-03 ENCOUNTER — Other Ambulatory Visit: Payer: Self-pay

## 2020-05-03 DIAGNOSIS — Z20822 Contact with and (suspected) exposure to covid-19: Secondary | ICD-10-CM | POA: Diagnosis not present

## 2020-05-04 LAB — SARS-COV-2, NAA 2 DAY TAT

## 2020-05-04 LAB — NOVEL CORONAVIRUS, NAA: SARS-CoV-2, NAA: NOT DETECTED

## 2021-02-02 ENCOUNTER — Ambulatory Visit: Payer: Medicaid Other

## 2021-03-03 DIAGNOSIS — F329 Major depressive disorder, single episode, unspecified: Secondary | ICD-10-CM | POA: Diagnosis not present

## 2021-03-28 DIAGNOSIS — F329 Major depressive disorder, single episode, unspecified: Secondary | ICD-10-CM | POA: Diagnosis not present
